# Patient Record
Sex: Male | Born: 1980 | Race: Black or African American | Hispanic: No | Marital: Single | Smoking: Former smoker
Health system: Southern US, Community
[De-identification: ages and names within clinical notes are randomized; demographics above are authoritative.]

## PROBLEM LIST (undated history)

## (undated) DIAGNOSIS — C801 Malignant (primary) neoplasm, unspecified: Secondary | ICD-10-CM

---

## 2010-01-27 ENCOUNTER — Observation Stay (HOSPITAL_COMMUNITY): Admission: AD | Admit: 2010-01-27 | Discharge: 2010-01-29 | Payer: Self-pay | Admitting: Internal Medicine

## 2010-01-27 ENCOUNTER — Ambulatory Visit: Payer: Self-pay | Admitting: Internal Medicine

## 2010-01-28 ENCOUNTER — Encounter (INDEPENDENT_AMBULATORY_CARE_PROVIDER_SITE_OTHER): Payer: Self-pay | Admitting: Internal Medicine

## 2010-01-31 ENCOUNTER — Ambulatory Visit: Payer: Self-pay | Admitting: Oncology

## 2010-02-01 LAB — CBC & DIFF AND RETIC
BASO%: 0.2 % (ref 0.0–2.0)
Eosinophils Absolute: 0 10*3/uL (ref 0.0–0.5)
HCT: 35.7 % — ABNORMAL LOW (ref 38.4–49.9)
Immature Retic Fract: 9.2 % (ref 0.00–13.40)
LYMPH%: 17.9 % (ref 14.0–49.0)
MCHC: 31.9 g/dL — ABNORMAL LOW (ref 32.0–36.0)
MONO#: 0.5 10*3/uL (ref 0.1–0.9)
NEUT#: 3.7 10*3/uL (ref 1.5–6.5)
Platelets: 400 10*3/uL (ref 140–400)
RBC: 4.37 10*6/uL (ref 4.20–5.82)
Retic %: 1.58 % (ref 0.50–1.60)
WBC: 5.2 10*3/uL (ref 4.0–10.3)
lymph#: 0.9 10*3/uL (ref 0.9–3.3)

## 2010-02-02 LAB — SEDIMENTATION RATE: Sed Rate: 56 mm/hr — ABNORMAL HIGH (ref 0–16)

## 2010-02-03 ENCOUNTER — Ambulatory Visit: Payer: Self-pay

## 2010-02-03 ENCOUNTER — Ambulatory Visit (HOSPITAL_COMMUNITY): Admission: RE | Admit: 2010-02-03 | Discharge: 2010-02-03 | Payer: Self-pay | Admitting: Oncology

## 2010-02-03 ENCOUNTER — Encounter: Payer: Self-pay | Admitting: Oncology

## 2010-02-03 ENCOUNTER — Ambulatory Visit: Payer: Self-pay | Admitting: Cardiology

## 2010-02-03 LAB — COMPREHENSIVE METABOLIC PANEL
ALT: 12 U/L (ref 0–53)
AST: 13 U/L (ref 0–37)
Albumin: 3.7 g/dL (ref 3.5–5.2)
Alkaline Phosphatase: 87 U/L (ref 39–117)
Potassium: 4.4 mEq/L (ref 3.5–5.3)
Sodium: 140 mEq/L (ref 135–145)
Total Bilirubin: 0.5 mg/dL (ref 0.3–1.2)
Total Protein: 7 g/dL (ref 6.0–8.3)

## 2010-02-03 LAB — URIC ACID: Uric Acid, Serum: 7.6 mg/dL (ref 4.0–7.8)

## 2010-02-04 ENCOUNTER — Other Ambulatory Visit: Admission: RE | Admit: 2010-02-04 | Discharge: 2010-02-04 | Payer: Self-pay | Admitting: Oncology

## 2010-02-08 ENCOUNTER — Ambulatory Visit (HOSPITAL_COMMUNITY): Admission: RE | Admit: 2010-02-08 | Discharge: 2010-02-08 | Payer: Self-pay | Admitting: Oncology

## 2010-02-10 ENCOUNTER — Ambulatory Visit (HOSPITAL_COMMUNITY): Admission: RE | Admit: 2010-02-10 | Discharge: 2010-02-10 | Payer: Self-pay | Admitting: Oncology

## 2010-02-15 LAB — COMPREHENSIVE METABOLIC PANEL
ALT: 20 U/L (ref 0–53)
Albumin: 3.6 g/dL (ref 3.5–5.2)
Alkaline Phosphatase: 103 U/L (ref 39–117)
CO2: 25 mEq/L (ref 19–32)
Glucose, Bld: 143 mg/dL — ABNORMAL HIGH (ref 70–99)
Potassium: 3.6 mEq/L (ref 3.5–5.3)
Sodium: 140 mEq/L (ref 135–145)
Total Protein: 6.4 g/dL (ref 6.0–8.3)

## 2010-02-15 LAB — CBC WITH DIFFERENTIAL/PLATELET
Basophils Absolute: 0 10*3/uL (ref 0.0–0.1)
HCT: 32.9 % — ABNORMAL LOW (ref 38.4–49.9)
HGB: 10.8 g/dL — ABNORMAL LOW (ref 13.0–17.1)
MONO#: 0 10*3/uL — ABNORMAL LOW (ref 0.1–0.9)
NEUT#: 29.7 10*3/uL — ABNORMAL HIGH (ref 1.5–6.5)
NEUT%: 98.2 % — ABNORMAL HIGH (ref 39.0–75.0)
WBC: 30.3 10*3/uL — ABNORMAL HIGH (ref 4.0–10.3)
lymph#: 0.5 10*3/uL — ABNORMAL LOW (ref 0.9–3.3)

## 2010-02-17 ENCOUNTER — Ambulatory Visit (HOSPITAL_COMMUNITY): Admission: RE | Admit: 2010-02-17 | Discharge: 2010-02-17 | Payer: Self-pay | Admitting: Oncology

## 2010-02-22 LAB — CBC WITH DIFFERENTIAL/PLATELET
BASO%: 0.2 % (ref 0.0–2.0)
EOS%: 0.2 % (ref 0.0–7.0)
Eosinophils Absolute: 0 10*3/uL (ref 0.0–0.5)
MCHC: 32 g/dL (ref 32.0–36.0)
MCV: 79.5 fL (ref 79.3–98.0)
MONO%: 3.9 % (ref 0.0–14.0)
NEUT#: 9.4 10*3/uL — ABNORMAL HIGH (ref 1.5–6.5)
RBC: 4.76 10*6/uL (ref 4.20–5.82)
RDW: 14.6 % (ref 11.0–14.6)
WBC: 11.2 10*3/uL — ABNORMAL HIGH (ref 4.0–10.3)

## 2010-02-22 LAB — COMPREHENSIVE METABOLIC PANEL
ALT: 14 U/L (ref 0–53)
AST: 16 U/L (ref 0–37)
Albumin: 4 g/dL (ref 3.5–5.2)
Alkaline Phosphatase: 83 U/L (ref 39–117)
Glucose, Bld: 111 mg/dL — ABNORMAL HIGH (ref 70–99)
Potassium: 3.9 mEq/L (ref 3.5–5.3)
Sodium: 141 mEq/L (ref 135–145)
Total Bilirubin: 0.2 mg/dL — ABNORMAL LOW (ref 0.3–1.2)
Total Protein: 6.8 g/dL (ref 6.0–8.3)

## 2010-03-01 DIAGNOSIS — J9819 Other pulmonary collapse: Secondary | ICD-10-CM | POA: Insufficient documentation

## 2010-03-02 ENCOUNTER — Ambulatory Visit: Payer: Self-pay | Admitting: Oncology

## 2010-03-02 ENCOUNTER — Ambulatory Visit (HOSPITAL_COMMUNITY): Admission: RE | Admit: 2010-03-02 | Discharge: 2010-03-02 | Payer: Self-pay | Admitting: Oncology

## 2010-03-03 ENCOUNTER — Telehealth: Payer: Self-pay | Admitting: Emergency Medicine

## 2010-03-04 LAB — CBC WITH DIFFERENTIAL/PLATELET
BASO%: 0.9 % (ref 0.0–2.0)
Basophils Absolute: 0 10*3/uL (ref 0.0–0.1)
EOS%: 0.2 % (ref 0.0–7.0)
HCT: 38.4 % (ref 38.4–49.9)
HGB: 12.4 g/dL — ABNORMAL LOW (ref 13.0–17.1)
LYMPH%: 32.4 % (ref 14.0–49.0)
MCH: 26.1 pg — ABNORMAL LOW (ref 27.2–33.4)
MCHC: 32.3 g/dL (ref 32.0–36.0)
MCV: 80.7 fL (ref 79.3–98.0)
NEUT%: 57 % (ref 39.0–75.0)
Platelets: 174 10*3/uL (ref 140–400)

## 2010-03-04 LAB — COMPREHENSIVE METABOLIC PANEL
AST: 13 U/L (ref 0–37)
Albumin: 4 g/dL (ref 3.5–5.2)
Alkaline Phosphatase: 62 U/L (ref 39–117)
BUN: 11 mg/dL (ref 6–23)
Calcium: 9.3 mg/dL (ref 8.4–10.5)
Creatinine, Ser: 0.81 mg/dL (ref 0.40–1.50)
Glucose, Bld: 86 mg/dL (ref 70–99)
Potassium: 4.2 mEq/L (ref 3.5–5.3)

## 2010-03-04 LAB — URIC ACID: Uric Acid, Serum: 6.3 mg/dL (ref 4.0–7.8)

## 2010-03-10 LAB — COMPREHENSIVE METABOLIC PANEL
ALT: 15 U/L (ref 0–53)
AST: 10 U/L (ref 0–37)
Albumin: 4.5 g/dL (ref 3.5–5.2)
Alkaline Phosphatase: 103 U/L (ref 39–117)
BUN: 17 mg/dL (ref 6–23)
CO2: 26 mEq/L (ref 19–32)
Calcium: 9.8 mg/dL (ref 8.4–10.5)
Chloride: 102 mEq/L (ref 96–112)
Creatinine, Ser: 0.74 mg/dL (ref 0.40–1.50)
Glucose, Bld: 150 mg/dL — ABNORMAL HIGH (ref 70–99)
Potassium: 3.7 mEq/L (ref 3.5–5.3)
Sodium: 141 mEq/L (ref 135–145)
Total Bilirubin: 0.4 mg/dL (ref 0.3–1.2)
Total Protein: 7 g/dL (ref 6.0–8.3)

## 2010-03-10 LAB — CBC WITH DIFFERENTIAL/PLATELET
BASO%: 0.1 % (ref 0.0–2.0)
Basophils Absolute: 0 10*3/uL (ref 0.0–0.1)
EOS%: 0 % (ref 0.0–7.0)
Eosinophils Absolute: 0 10*3/uL (ref 0.0–0.5)
HCT: 35 % — ABNORMAL LOW (ref 38.4–49.9)
HGB: 11.9 g/dL — ABNORMAL LOW (ref 13.0–17.1)
LYMPH%: 6.1 % — ABNORMAL LOW (ref 14.0–49.0)
MCH: 27.3 pg (ref 27.2–33.4)
MCHC: 34 g/dL (ref 32.0–36.0)
MCV: 80.2 fL (ref 79.3–98.0)
MONO#: 0.1 10*3/uL (ref 0.1–0.9)
MONO%: 0.5 % (ref 0.0–14.0)
NEUT#: 14.7 10*3/uL — ABNORMAL HIGH (ref 1.5–6.5)
NEUT%: 93.3 % — ABNORMAL HIGH (ref 39.0–75.0)
Platelets: 226 10*3/uL (ref 140–400)
RBC: 4.36 10*6/uL (ref 4.20–5.82)
RDW: 18.4 % — ABNORMAL HIGH (ref 11.0–14.6)
WBC: 15.7 10*3/uL — ABNORMAL HIGH (ref 4.0–10.3)
lymph#: 1 10*3/uL (ref 0.9–3.3)

## 2010-03-10 LAB — LACTATE DEHYDROGENASE: LDH: 196 U/L (ref 94–250)

## 2010-03-21 ENCOUNTER — Ambulatory Visit (HOSPITAL_COMMUNITY): Admission: RE | Admit: 2010-03-21 | Discharge: 2010-03-21 | Payer: Self-pay | Admitting: Oncology

## 2010-03-24 ENCOUNTER — Ambulatory Visit (HOSPITAL_COMMUNITY): Admission: RE | Admit: 2010-03-24 | Discharge: 2010-03-24 | Payer: Self-pay | Admitting: Oncology

## 2010-03-24 ENCOUNTER — Ambulatory Visit: Payer: Self-pay | Admitting: Emergency Medicine

## 2010-03-24 DIAGNOSIS — C8590 Non-Hodgkin lymphoma, unspecified, unspecified site: Secondary | ICD-10-CM

## 2010-03-25 LAB — CBC WITH DIFFERENTIAL/PLATELET
BASO%: 1.2 % (ref 0.0–2.0)
Basophils Absolute: 0.1 10*3/uL (ref 0.0–0.1)
EOS%: 0.5 % (ref 0.0–7.0)
Eosinophils Absolute: 0 10*3/uL (ref 0.0–0.5)
HCT: 36.1 % — ABNORMAL LOW (ref 38.4–49.9)
HGB: 12.5 g/dL — ABNORMAL LOW (ref 13.0–17.1)
LYMPH%: 16.7 % (ref 14.0–49.0)
MCH: 27.7 pg (ref 27.2–33.4)
MCHC: 34.7 g/dL (ref 32.0–36.0)
MCV: 80 fL (ref 79.3–98.0)
MONO#: 0.5 10*3/uL (ref 0.1–0.9)
MONO%: 9.8 % (ref 0.0–14.0)
NEUT#: 3.6 10*3/uL (ref 1.5–6.5)
NEUT%: 71.8 % (ref 39.0–75.0)
Platelets: 268 10*3/uL (ref 140–400)
RBC: 4.51 10*6/uL (ref 4.20–5.82)
RDW: 20.3 % — ABNORMAL HIGH (ref 11.0–14.6)
WBC: 5 10*3/uL (ref 4.0–10.3)
lymph#: 0.8 10*3/uL — ABNORMAL LOW (ref 0.9–3.3)

## 2010-03-25 LAB — COMPREHENSIVE METABOLIC PANEL
ALT: 19 U/L (ref 0–53)
AST: 20 U/L (ref 0–37)
Albumin: 3.8 g/dL (ref 3.5–5.2)
Alkaline Phosphatase: 72 U/L (ref 39–117)
BUN: 8 mg/dL (ref 6–23)
CO2: 30 mEq/L (ref 19–32)
Calcium: 9.6 mg/dL (ref 8.4–10.5)
Chloride: 104 mEq/L (ref 96–112)
Creatinine, Ser: 0.85 mg/dL (ref 0.40–1.50)
Glucose, Bld: 135 mg/dL — ABNORMAL HIGH (ref 70–99)
Potassium: 3.6 mEq/L (ref 3.5–5.3)
Sodium: 141 mEq/L (ref 135–145)
Total Bilirubin: 0.5 mg/dL (ref 0.3–1.2)
Total Protein: 7 g/dL (ref 6.0–8.3)

## 2010-03-25 LAB — LACTATE DEHYDROGENASE: LDH: 127 U/L (ref 94–250)

## 2010-03-25 LAB — URIC ACID: Uric Acid, Serum: 7.6 mg/dL (ref 4.0–7.8)

## 2010-03-30 ENCOUNTER — Ambulatory Visit: Payer: Self-pay | Admitting: Oncology

## 2010-04-01 LAB — CBC WITH DIFFERENTIAL/PLATELET
BASO%: 0.3 % (ref 0.0–2.0)
Basophils Absolute: 0 10*3/uL (ref 0.0–0.1)
EOS%: 0.2 % (ref 0.0–7.0)
Eosinophils Absolute: 0 10*3/uL (ref 0.0–0.5)
HCT: 33 % — ABNORMAL LOW (ref 38.4–49.9)
HGB: 11.4 g/dL — ABNORMAL LOW (ref 13.0–17.1)
LYMPH%: 13.1 % — ABNORMAL LOW (ref 14.0–49.0)
MCH: 28 pg (ref 27.2–33.4)
MCHC: 34.6 g/dL (ref 32.0–36.0)
MCV: 80.9 fL (ref 79.3–98.0)
MONO#: 0.1 10*3/uL (ref 0.1–0.9)
MONO%: 2 % (ref 0.0–14.0)
NEUT#: 5.2 10*3/uL (ref 1.5–6.5)
NEUT%: 84.4 % — ABNORMAL HIGH (ref 39.0–75.0)
Platelets: 188 10*3/uL (ref 140–400)
RBC: 4.07 10*6/uL — ABNORMAL LOW (ref 4.20–5.82)
RDW: 21.9 % — ABNORMAL HIGH (ref 11.0–14.6)
WBC: 6.2 10*3/uL (ref 4.0–10.3)
lymph#: 0.8 10*3/uL — ABNORMAL LOW (ref 0.9–3.3)

## 2010-04-01 LAB — COMPREHENSIVE METABOLIC PANEL
ALT: 18 U/L (ref 0–53)
AST: 12 U/L (ref 0–37)
Albumin: 3.6 g/dL (ref 3.5–5.2)
Alkaline Phosphatase: 90 U/L (ref 39–117)
BUN: 13 mg/dL (ref 6–23)
CO2: 31 mEq/L (ref 19–32)
Calcium: 9.1 mg/dL (ref 8.4–10.5)
Chloride: 102 mEq/L (ref 96–112)
Creatinine, Ser: 0.87 mg/dL (ref 0.40–1.50)
Glucose, Bld: 87 mg/dL (ref 70–99)
Potassium: 3.8 mEq/L (ref 3.5–5.3)
Sodium: 140 mEq/L (ref 135–145)
Total Bilirubin: 0.7 mg/dL (ref 0.3–1.2)
Total Protein: 6.4 g/dL (ref 6.0–8.3)

## 2010-04-01 LAB — URIC ACID: Uric Acid, Serum: 6.1 mg/dL (ref 4.0–7.8)

## 2010-04-01 LAB — LACTATE DEHYDROGENASE: LDH: 120 U/L (ref 94–250)

## 2010-04-04 ENCOUNTER — Ambulatory Visit
Admission: RE | Admit: 2010-04-04 | Discharge: 2010-05-02 | Payer: Self-pay | Source: Home / Self Care | Admitting: Radiation Oncology

## 2010-04-15 LAB — COMPREHENSIVE METABOLIC PANEL
ALT: 20 U/L (ref 0–53)
AST: 18 U/L (ref 0–37)
BUN: 11 mg/dL (ref 6–23)
Creatinine, Ser: 0.88 mg/dL (ref 0.40–1.50)
Total Bilirubin: 0.6 mg/dL (ref 0.3–1.2)

## 2010-04-15 LAB — CBC WITH DIFFERENTIAL/PLATELET
Basophils Absolute: 0 10*3/uL (ref 0.0–0.1)
Eosinophils Absolute: 0 10*3/uL (ref 0.0–0.5)
HGB: 12.5 g/dL — ABNORMAL LOW (ref 13.0–17.1)
MCV: 81 fL (ref 79.3–98.0)
MONO#: 0.5 10*3/uL (ref 0.1–0.9)
MONO%: 10.6 % (ref 0.0–14.0)
NEUT#: 3.2 10*3/uL (ref 1.5–6.5)
RDW: 21.5 % — ABNORMAL HIGH (ref 11.0–14.6)
WBC: 4.3 10*3/uL (ref 4.0–10.3)

## 2010-04-15 LAB — LACTATE DEHYDROGENASE: LDH: 134 U/L (ref 94–250)

## 2010-04-22 LAB — CBC WITH DIFFERENTIAL/PLATELET
BASO%: 0.1 % (ref 0.0–2.0)
EOS%: 0.3 % (ref 0.0–7.0)
HGB: 11.3 g/dL — ABNORMAL LOW (ref 13.0–17.1)
MCH: 27.5 pg (ref 27.2–33.4)
MCHC: 33.3 g/dL (ref 32.0–36.0)
RBC: 4.11 10*6/uL — ABNORMAL LOW (ref 4.20–5.82)
RDW: 22.2 % — ABNORMAL HIGH (ref 11.0–14.6)
lymph#: 0.8 10*3/uL — ABNORMAL LOW (ref 0.9–3.3)

## 2010-04-22 LAB — COMPREHENSIVE METABOLIC PANEL
ALT: 13 U/L (ref 0–53)
AST: 8 U/L (ref 0–37)
Albumin: 3.9 g/dL (ref 3.5–5.2)
Alkaline Phosphatase: 83 U/L (ref 39–117)
Calcium: 8.8 mg/dL (ref 8.4–10.5)
Chloride: 101 mEq/L (ref 96–112)
Potassium: 3.4 mEq/L — ABNORMAL LOW (ref 3.5–5.3)
Sodium: 139 mEq/L (ref 135–145)

## 2010-05-03 ENCOUNTER — Ambulatory Visit: Payer: Self-pay | Admitting: Oncology

## 2010-05-05 LAB — CBC WITH DIFFERENTIAL/PLATELET
BASO%: 0.6 % (ref 0.0–2.0)
Eosinophils Absolute: 0 10*3/uL (ref 0.0–0.5)
HCT: 34.6 % — ABNORMAL LOW (ref 38.4–49.9)
HGB: 12 g/dL — ABNORMAL LOW (ref 13.0–17.1)
MCHC: 34.7 g/dL (ref 32.0–36.0)
MONO#: 0.6 10*3/uL (ref 0.1–0.9)
NEUT#: 3.4 10*3/uL (ref 1.5–6.5)
NEUT%: 70.4 % (ref 39.0–75.0)
Platelets: 320 10*3/uL (ref 140–400)
WBC: 4.8 10*3/uL (ref 4.0–10.3)
lymph#: 0.8 10*3/uL — ABNORMAL LOW (ref 0.9–3.3)

## 2010-05-05 LAB — URIC ACID: Uric Acid, Serum: 8.7 mg/dL — ABNORMAL HIGH (ref 4.0–7.8)

## 2010-05-05 LAB — COMPREHENSIVE METABOLIC PANEL
ALT: 18 U/L (ref 0–53)
CO2: 28 mEq/L (ref 19–32)
Calcium: 9.3 mg/dL (ref 8.4–10.5)
Chloride: 105 mEq/L (ref 96–112)
Creatinine, Ser: 0.97 mg/dL (ref 0.40–1.50)
Glucose, Bld: 107 mg/dL — ABNORMAL HIGH (ref 70–99)
Total Protein: 6.3 g/dL (ref 6.0–8.3)

## 2010-05-05 LAB — LACTATE DEHYDROGENASE: LDH: 136 U/L (ref 94–250)

## 2010-05-13 LAB — CBC WITH DIFFERENTIAL/PLATELET
EOS%: 0.8 % (ref 0.0–7.0)
MCH: 28.4 pg (ref 27.2–33.4)
MCV: 84 fL (ref 79.3–98.0)
MONO%: 2.1 % (ref 0.0–14.0)
NEUT#: 6.1 10*3/uL (ref 1.5–6.5)
RBC: 3.81 10*6/uL — ABNORMAL LOW (ref 4.20–5.82)
RDW: 19.1 % — ABNORMAL HIGH (ref 11.0–14.6)

## 2010-05-26 LAB — COMPREHENSIVE METABOLIC PANEL
Albumin: 4 g/dL (ref 3.5–5.2)
Alkaline Phosphatase: 73 U/L (ref 39–117)
CO2: 25 mEq/L (ref 19–32)
Glucose, Bld: 100 mg/dL — ABNORMAL HIGH (ref 70–99)
Potassium: 3.6 mEq/L (ref 3.5–5.3)
Sodium: 143 mEq/L (ref 135–145)
Total Protein: 6.3 g/dL (ref 6.0–8.3)

## 2010-05-26 LAB — CBC WITH DIFFERENTIAL/PLATELET
Eosinophils Absolute: 0.1 10*3/uL (ref 0.0–0.5)
MONO#: 0.7 10*3/uL (ref 0.1–0.9)
MONO%: 13.2 % (ref 0.0–14.0)
NEUT#: 3.3 10*3/uL (ref 1.5–6.5)
RBC: 4.2 10*6/uL (ref 4.20–5.82)
RDW: 16.4 % — ABNORMAL HIGH (ref 11.0–14.6)
WBC: 5.1 10*3/uL (ref 4.0–10.3)
lymph#: 0.9 10*3/uL (ref 0.9–3.3)

## 2010-05-26 LAB — URIC ACID: Uric Acid, Serum: 6.7 mg/dL (ref 4.0–7.8)

## 2010-06-02 ENCOUNTER — Ambulatory Visit: Payer: Self-pay | Admitting: Oncology

## 2010-06-03 LAB — CBC WITH DIFFERENTIAL/PLATELET
BASO%: 0 % (ref 0.0–2.0)
EOS%: 1.4 % (ref 0.0–7.0)
HCT: 34.1 % — ABNORMAL LOW (ref 38.4–49.9)
LYMPH%: 12.8 % — ABNORMAL LOW (ref 14.0–49.0)
MCH: 28.6 pg (ref 27.2–33.4)
MCHC: 33.9 g/dL (ref 32.0–36.0)
MONO%: 6.6 % (ref 0.0–14.0)
NEUT%: 79.2 % — ABNORMAL HIGH (ref 39.0–75.0)
lymph#: 0.5 10*3/uL — ABNORMAL LOW (ref 0.9–3.3)

## 2010-06-10 ENCOUNTER — Ambulatory Visit (HOSPITAL_COMMUNITY)
Admission: RE | Admit: 2010-06-10 | Discharge: 2010-06-10 | Payer: Self-pay | Source: Home / Self Care | Attending: Oncology | Admitting: Oncology

## 2010-06-10 LAB — GLUCOSE, CAPILLARY: Glucose-Capillary: 87 mg/dL (ref 70–99)

## 2010-06-14 ENCOUNTER — Ambulatory Visit
Admission: RE | Admit: 2010-06-14 | Discharge: 2010-07-05 | Payer: Self-pay | Source: Home / Self Care | Attending: Radiation Oncology | Admitting: Radiation Oncology

## 2010-06-17 LAB — CBC WITH DIFFERENTIAL/PLATELET
BASO%: 0.8 % (ref 0.0–2.0)
Basophils Absolute: 0 10*3/uL (ref 0.0–0.1)
EOS%: 1 % (ref 0.0–7.0)
Eosinophils Absolute: 0 10*3/uL (ref 0.0–0.5)
HCT: 36.2 % — ABNORMAL LOW (ref 38.4–49.9)
HGB: 12.3 g/dL — ABNORMAL LOW (ref 13.0–17.1)
LYMPH%: 26.2 % (ref 14.0–49.0)
MCH: 28.4 pg (ref 27.2–33.4)
MCHC: 34 g/dL (ref 32.0–36.0)
MCV: 83.6 fL (ref 79.3–98.0)
MONO#: 0.6 10*3/uL (ref 0.1–0.9)
MONO%: 15.6 % — ABNORMAL HIGH (ref 0.0–14.0)
NEUT#: 2.3 10*3/uL (ref 1.5–6.5)
NEUT%: 56.4 % (ref 39.0–75.0)
Platelets: 334 10*3/uL (ref 140–400)
RBC: 4.33 10*6/uL (ref 4.20–5.82)
RDW: 14.7 % — ABNORMAL HIGH (ref 11.0–14.6)
WBC: 4.1 10*3/uL (ref 4.0–10.3)
lymph#: 1.1 10*3/uL (ref 0.9–3.3)

## 2010-07-05 NOTE — Assessment & Plan Note (Signed)
Summary: NHL   Visit Type:  Follow-up Primary Provider/Referring Provider:  Michel Bickers MD  CC:  hfu. pt states he has no problems and has been doing fine. pt currently on chemo. Marland Kitchen  History of Present Illness: Very pleasant man seen by our service during hosp 8/11 when he was dx with NHL. He presented w R hilar mass with RML and RUL atelectasis and effusion. Needle bx was performed to make dx. He was seen by Dr Donnie Coffin, has been treated with chemo therapy. He is feeling much better. A CT scan of the chest was performed 03/24/10 and showed remarkable improvement in his tumor burden, R lung aeration, and effusion. He presents for hosp f/u today.   Preventive Screening-Counseling & Management  Alcohol-Tobacco     Smoking Status: quit  Current Medications (verified): 1)  Allopurinol 300 Mg Tabs (Allopurinol) .... Once Daily 2)  Prednisone 50 Mg Tabs (Prednisone) .... Taper As Directed  Allergies (verified): No Known Drug Allergies  Past History:  Family History: Last updated: 03/24/2010 no known family history  Past Medical History: non hodgkins lymphoma  Past Surgical History: port a cath  Family History: no known family history  Social History: Reviewed history and no changes required. Patient states former smoker. quit 2008. started 1999. 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    Vital Signs:  Patient profile:   30  year old male Height:      68 inches Weight:      181.50 pounds BMI:     27.70 O2 Sat:      99 % on Room air Temp:     98.2 degrees F oral Pulse rate:   101 / minute BP sitting:   122 / 80  (right arm) Cuff size:   regular  Vitals Entered By: Carver Fila (March 24, 2010 3:04 PM)  O2 Flow:  Room air CC: hfu. pt states he has no problems and has been doing fine. pt currently on chemo.  Comments meds and allergies updated Phone number updated Carver Fila  March 24, 2010 3:04 PM    Physical Exam  General:  well developed, well nourished, in no acute distress Head:  normocephalic and atraumatic Eyes:  conjunctiva and sclera clear Nose:  no deformity, discharge, inflammation, or lesions Mouth:  no deformity or lesions Neck:  no masses, thyromegaly, or abnormal cervical nodes Lungs:  clear bilaterally to auscultation and percussion Heart:  regular rate and rhythm, S1, S2 without murmurs, rubs, gallops, or clicks Msk:  no deformity or scoliosis noted with normal posture Extremities:  no clubbing, cyanosis, edema, or deformity noted Neurologic:  non-focal Skin:  intact without lesions or rashes Psych:  alert and cooperative; normal mood and affect; normal attention span and concentration   CT Scan  Procedure date:  03/24/2010  Findings:       CT CHEST    Findings:  No enlarged axillary lymph nodes.    Pretracheal lymph node within the superior mediastinum currently   measures 1 cm, image number 19.  On the previous exam  this measured   2.1 cm.  Right paratracheal lymph node measures 1 cm, image 22.   This is compared with 1.9 cm previously.    The large right suprahilar / paramediastinal mass has decreased in   size from previous exam.  On today's study there is residual   paramediastinal soft tissue which measures 3.2 x 6.0 cm, image 29.   On the previous exam this measured approximately 10.7 x 9.1 cm.    Within the periphery of the right upper lobe there are  several   peripheral areas of airspace consolidation which measure up to 4.30   x 2.8 cm, image number 25.    Within the right middle lobe there is a residual area of soft   tissue attenuation measuring 1.8 x 3.3 cm, image 39.  On the   previous examination this measured 10.5 x 8.0 cm.    Left lung appears clear.    The trachea is now midline and appears patent.    Interval resolution of previously noted right pleural   effusion.Review of the visualized osseous structures shows no   suspicious bone lesions.    IMPRESSION:    1.  Significant interval response to therapy.   2.  There are several areas of residual soft tissue within the   right upper lobe, and  right middle lobe which may represent   residual tumor.  A PET CT may be helpful for further evaluation.   3.  Resolution of right pleural effusion.  Impression & Recommendations:  Problem # 1:  NON-HODGKIN'S LYMPHOMA (ICD-202.80)  Doing very well on chemo - rx per Dr Donnie Coffin - he can follow with me as needed   Orders: New Patient Level IV (40981)  Medications Added to Medication List This Visit: 1)  Allopurinol 300 Mg Tabs (Allopurinol) .... Once daily 2)  Prednisone 50 Mg Tabs (Prednisone) .... Taper as directed   Immunization History:  Influenza Immunization History:    Influenza:  historical (01/04/2008)

## 2010-07-05 NOTE — Progress Notes (Signed)
Summary: nos appt  Phone Note Call from Patient   Caller: [email or web address]  Call For: Addalyne Vandehei Summary of Call: Rsc nos from 9/28 to 10/20 @ 3:10p. Initial call taken by: Darletta Moll,  March 03, 2010 10:09 AM

## 2010-07-05 NOTE — Miscellaneous (Signed)
Summary: Orders Update  Clinical Lists Changes  Orders: Added new Service order of New Patient Level IV (99204) - Signed 

## 2010-07-06 ENCOUNTER — Ambulatory Visit: Payer: BC Managed Care – PPO | Attending: Radiation Oncology | Admitting: Radiation Oncology

## 2010-07-06 DIAGNOSIS — C8582 Other specified types of non-Hodgkin lymphoma, intrathoracic lymph nodes: Secondary | ICD-10-CM | POA: Insufficient documentation

## 2010-07-06 DIAGNOSIS — Z510 Encounter for antineoplastic radiation therapy: Secondary | ICD-10-CM | POA: Insufficient documentation

## 2010-07-06 DIAGNOSIS — C8589 Other specified types of non-Hodgkin lymphoma, extranodal and solid organ sites: Secondary | ICD-10-CM | POA: Insufficient documentation

## 2010-08-17 LAB — GLUCOSE, CAPILLARY: Glucose-Capillary: 91 mg/dL (ref 70–99)

## 2010-08-18 LAB — DIFFERENTIAL
Eosinophils Relative: 1 % (ref 0–5)
Lymphocytes Relative: 18 % (ref 12–46)
Lymphs Abs: 1.1 10*3/uL (ref 0.7–4.0)
Monocytes Absolute: 0.8 10*3/uL (ref 0.1–1.0)
Monocytes Relative: 13 % — ABNORMAL HIGH (ref 3–12)
Neutro Abs: 4.1 10*3/uL (ref 1.7–7.7)

## 2010-08-18 LAB — CBC
HCT: 33.1 % — ABNORMAL LOW (ref 39.0–52.0)
Hemoglobin: 11.1 g/dL — ABNORMAL LOW (ref 13.0–17.0)
MCV: 79.2 fL (ref 78.0–100.0)
RBC: 4.18 MIL/uL — ABNORMAL LOW (ref 4.22–5.81)
RDW: 13.6 % (ref 11.5–15.5)
WBC: 6 10*3/uL (ref 4.0–10.5)

## 2010-08-18 LAB — GLUCOSE, CAPILLARY: Glucose-Capillary: 102 mg/dL — ABNORMAL HIGH (ref 70–99)

## 2010-08-19 LAB — BASIC METABOLIC PANEL
BUN: 8 mg/dL (ref 6–23)
CO2: 25 mEq/L (ref 19–32)
CO2: 27 mEq/L (ref 19–32)
CO2: 29 mEq/L (ref 19–32)
Calcium: 9 mg/dL (ref 8.4–10.5)
Chloride: 100 mEq/L (ref 96–112)
Creatinine, Ser: 0.78 mg/dL (ref 0.4–1.5)
Creatinine, Ser: 0.92 mg/dL (ref 0.4–1.5)
GFR calc Af Amer: >60 mL/min (ref >60)
GFR calc Af Amer: >60 mL/min (ref >60)
GFR calc non Af Amer: >60 mL/min (ref >60)
Glucose, Bld: 102 mg/dL — ABNORMAL HIGH (ref 70–99)
Glucose, Bld: 112 mg/dL — ABNORMAL HIGH (ref 70–99)
Glucose, Bld: 92 mg/dL (ref 70–99)
Potassium: 3.9 mEq/L (ref 3.5–5.1)
Sodium: 137 mEq/L (ref 135–145)

## 2010-08-19 LAB — CARDIAC PANEL(CRET KIN+CKTOT+MB+TROPI)
CK, MB: 0.2 ng/mL — ABNORMAL LOW (ref 0.3–4.0)
Relative Index: INVALID (ref 0.0–2.5)
Relative Index: INVALID (ref 0.0–2.5)
Total CK: 56 U/L (ref 7–232)
Troponin I: <0.01 ng/mL (ref 0.00–0.06)

## 2010-08-19 LAB — CBC
Hemoglobin: 10.2 g/dL — ABNORMAL LOW (ref 13.0–17.0)
MCH: 25.9 pg — ABNORMAL LOW (ref 26.0–34.0)
MCH: 26.4 pg (ref 26.0–34.0)
MCHC: 31.5 g/dL (ref 30.0–36.0)
MCHC: 32.4 g/dL (ref 30.0–36.0)
MCV: 81.2 fL (ref 78.0–100.0)
MCV: 82 fL (ref 78.0–100.0)
Platelets: 455 10*3/uL — ABNORMAL HIGH (ref 150–400)
Platelets: 469 10*3/uL — ABNORMAL HIGH (ref 150–400)
Platelets: 469 10*3/uL — ABNORMAL HIGH (ref 150–400)
RBC: 3.93 MIL/uL — ABNORMAL LOW (ref 4.22–5.81)
RBC: 4.09 MIL/uL — ABNORMAL LOW (ref 4.22–5.81)
RBC: 4.1 MIL/uL — ABNORMAL LOW (ref 4.22–5.81)
RDW: 13 % (ref 11.5–15.5)

## 2010-08-19 LAB — URINE CULTURE
Colony Count: 7000
Culture  Setup Time: 201108260405

## 2010-08-19 LAB — DIFFERENTIAL
Basophils Absolute: 0 10*3/uL (ref 0.0–0.1)
Basophils Relative: 0 % (ref 0–1)
Eosinophils Absolute: 0.1 10*3/uL (ref 0.0–0.7)
Eosinophils Relative: 1 % (ref 0–5)
Neutrophils Relative %: 73 % (ref 43–77)

## 2010-08-19 LAB — POCT I-STAT 3, ART BLOOD GAS (G3+)
TCO2: 30 mmol/L (ref 0–100)
pCO2 arterial: 42.4 mmHg (ref 35.0–45.0)
pH, Arterial: 7.438 (ref 7.350–7.450)

## 2010-08-19 LAB — CULTURE, BLOOD (ROUTINE X 2): Culture: NO GROWTH

## 2010-08-19 LAB — MAGNESIUM: Magnesium: 2 mg/dL (ref 1.5–2.5)

## 2010-08-19 LAB — GLUCOSE, CAPILLARY: Glucose-Capillary: 98 mg/dL (ref 70–99)

## 2010-08-19 LAB — URINALYSIS, ROUTINE W REFLEX MICROSCOPIC
Glucose, UA: NEGATIVE mg/dL
Hgb urine dipstick: NEGATIVE
Specific Gravity, Urine: >1.046 — ABNORMAL HIGH (ref 1.005–1.030)

## 2010-08-19 LAB — PROTIME-INR
INR: 1.05 (ref 0.00–1.49)
Prothrombin Time: 13.9 seconds (ref 11.6–15.2)

## 2010-08-19 LAB — PROCALCITONIN: Procalcitonin: <0.1 ng/mL

## 2010-08-23 ENCOUNTER — Ambulatory Visit: Payer: BC Managed Care – PPO | Attending: Radiation Oncology | Admitting: Radiation Oncology

## 2010-08-29 ENCOUNTER — Other Ambulatory Visit: Payer: Self-pay | Admitting: Physician Assistant

## 2010-08-29 ENCOUNTER — Encounter (HOSPITAL_BASED_OUTPATIENT_CLINIC_OR_DEPARTMENT_OTHER): Payer: BC Managed Care – PPO | Admitting: Oncology

## 2010-08-29 DIAGNOSIS — C8589 Other specified types of non-Hodgkin lymphoma, extranodal and solid organ sites: Secondary | ICD-10-CM

## 2010-08-29 DIAGNOSIS — C8582 Other specified types of non-Hodgkin lymphoma, intrathoracic lymph nodes: Secondary | ICD-10-CM

## 2010-08-29 LAB — CBC WITH DIFFERENTIAL/PLATELET
BASO%: 0.4 % (ref 0.0–2.0)
Eosinophils Absolute: 0 10*3/uL (ref 0.0–0.5)
LYMPH%: 17.7 % (ref 14.0–49.0)
MCHC: 35 g/dL (ref 32.0–36.0)
MONO#: 0.3 10*3/uL (ref 0.1–0.9)
NEUT#: 1.8 10*3/uL (ref 1.5–6.5)
RBC: 4.91 10*6/uL (ref 4.20–5.82)
RDW: 14.5 % (ref 11.0–14.6)
WBC: 2.7 10*3/uL — ABNORMAL LOW (ref 4.0–10.3)

## 2010-08-29 LAB — COMPREHENSIVE METABOLIC PANEL
ALT: 39 U/L (ref 0–53)
AST: 24 U/L (ref 0–37)
CO2: 26 mEq/L (ref 19–32)
Chloride: 107 mEq/L (ref 96–112)
Creatinine, Ser: 0.77 mg/dL (ref 0.40–1.50)
Sodium: 140 mEq/L (ref 135–145)
Total Bilirubin: 0.6 mg/dL (ref 0.3–1.2)
Total Protein: 6.9 g/dL (ref 6.0–8.3)

## 2010-08-29 LAB — LACTATE DEHYDROGENASE: LDH: 128 U/L (ref 94–250)

## 2010-08-30 ENCOUNTER — Other Ambulatory Visit: Payer: Self-pay | Admitting: Oncology

## 2010-08-30 DIAGNOSIS — C8580 Other specified types of non-Hodgkin lymphoma, unspecified site: Secondary | ICD-10-CM

## 2010-09-29 ENCOUNTER — Encounter (HOSPITAL_COMMUNITY)
Admission: RE | Admit: 2010-09-29 | Discharge: 2010-09-29 | Disposition: A | Discharge status: Home / Self Care | Payer: BC Managed Care – PPO | Source: Ambulatory Visit | Attending: Oncology | Admitting: Oncology

## 2010-09-29 ENCOUNTER — Encounter (HOSPITAL_COMMUNITY): Payer: Self-pay

## 2010-09-29 DIAGNOSIS — C8580 Other specified types of non-Hodgkin lymphoma, unspecified site: Secondary | ICD-10-CM

## 2010-09-29 DIAGNOSIS — Z923 Personal history of irradiation: Secondary | ICD-10-CM | POA: Insufficient documentation

## 2010-09-29 DIAGNOSIS — Z9221 Personal history of antineoplastic chemotherapy: Secondary | ICD-10-CM | POA: Insufficient documentation

## 2010-09-29 DIAGNOSIS — C8589 Other specified types of non-Hodgkin lymphoma, extranodal and solid organ sites: Secondary | ICD-10-CM | POA: Insufficient documentation

## 2010-09-29 DIAGNOSIS — M799 Soft tissue disorder, unspecified: Secondary | ICD-10-CM | POA: Insufficient documentation

## 2010-09-29 HISTORY — DX: Malignant (primary) neoplasm, unspecified: C80.1

## 2010-09-29 LAB — GLUCOSE, CAPILLARY: Glucose-Capillary: 90 mg/dL (ref 70–99)

## 2010-09-29 MED ORDER — IOHEXOL 300 MG/ML  SOLN
100.0000 mL | Freq: Once | INTRAMUSCULAR | Status: AC | PRN
  Administered 2010-09-29: 100 mL via INTRAVENOUS

## 2010-09-29 MED ORDER — FLUDEOXYGLUCOSE F - 18 (FDG) INJECTION
15.6000 | Freq: Once | INTRAVENOUS | Status: AC | PRN
  Administered 2010-09-29: 15.6 via INTRAVENOUS

## 2010-10-24 ENCOUNTER — Other Ambulatory Visit: Payer: Self-pay | Admitting: Physician Assistant

## 2010-10-24 ENCOUNTER — Other Ambulatory Visit: Payer: Self-pay | Admitting: Oncology

## 2010-10-24 ENCOUNTER — Encounter (HOSPITAL_BASED_OUTPATIENT_CLINIC_OR_DEPARTMENT_OTHER): Payer: BC Managed Care – PPO | Admitting: Oncology

## 2010-10-24 DIAGNOSIS — C8582 Other specified types of non-Hodgkin lymphoma, intrathoracic lymph nodes: Secondary | ICD-10-CM

## 2010-10-24 DIAGNOSIS — C8589 Other specified types of non-Hodgkin lymphoma, extranodal and solid organ sites: Secondary | ICD-10-CM

## 2010-10-24 DIAGNOSIS — C8590 Non-Hodgkin lymphoma, unspecified, unspecified site: Secondary | ICD-10-CM

## 2010-10-24 LAB — CBC WITH DIFFERENTIAL/PLATELET
Eosinophils Absolute: 0.1 10*3/uL (ref 0.0–0.5)
HCT: 40.4 % (ref 38.4–49.9)
HGB: 14.3 g/dL (ref 13.0–17.1)
MCH: 30.1 pg (ref 27.2–33.4)
MCHC: 35.3 g/dL (ref 32.0–36.0)
MCV: 85.3 fL (ref 79.3–98.0)

## 2010-10-24 LAB — COMPREHENSIVE METABOLIC PANEL
Albumin: 4.6 g/dL (ref 3.5–5.2)
CO2: 23 mEq/L (ref 19–32)
Chloride: 105 mEq/L (ref 96–112)
Glucose, Bld: 88 mg/dL (ref 70–99)
Potassium: 4.3 mEq/L (ref 3.5–5.3)
Sodium: 138 mEq/L (ref 135–145)
Total Protein: 6.6 g/dL (ref 6.0–8.3)

## 2010-10-24 LAB — LACTATE DEHYDROGENASE: LDH: 192 U/L (ref 94–250)

## 2010-12-19 ENCOUNTER — Encounter (HOSPITAL_BASED_OUTPATIENT_CLINIC_OR_DEPARTMENT_OTHER): Payer: BC Managed Care – PPO | Admitting: Oncology

## 2010-12-19 DIAGNOSIS — C8589 Other specified types of non-Hodgkin lymphoma, extranodal and solid organ sites: Secondary | ICD-10-CM

## 2010-12-19 DIAGNOSIS — Z452 Encounter for adjustment and management of vascular access device: Secondary | ICD-10-CM

## 2010-12-20 ENCOUNTER — Ambulatory Visit
Admission: RE | Admit: 2010-12-20 | Discharge: 2010-12-20 | Disposition: A | Discharge status: Home / Self Care | Payer: BC Managed Care – PPO | Source: Ambulatory Visit | Attending: Radiation Oncology | Admitting: Radiation Oncology

## 2011-02-07 ENCOUNTER — Encounter (HOSPITAL_COMMUNITY)
Admission: RE | Admit: 2011-02-07 | Discharge: 2011-02-07 | Disposition: A | Discharge status: Home / Self Care | Payer: BC Managed Care – PPO | Source: Ambulatory Visit | Attending: Oncology | Admitting: Oncology

## 2011-02-07 ENCOUNTER — Encounter (HOSPITAL_COMMUNITY): Payer: Self-pay

## 2011-02-07 DIAGNOSIS — J984 Other disorders of lung: Secondary | ICD-10-CM | POA: Insufficient documentation

## 2011-02-07 DIAGNOSIS — K7689 Other specified diseases of liver: Secondary | ICD-10-CM | POA: Insufficient documentation

## 2011-02-07 DIAGNOSIS — C8590 Non-Hodgkin lymphoma, unspecified, unspecified site: Secondary | ICD-10-CM

## 2011-02-07 DIAGNOSIS — C8589 Other specified types of non-Hodgkin lymphoma, extranodal and solid organ sites: Secondary | ICD-10-CM | POA: Insufficient documentation

## 2011-02-07 MED ORDER — FLUDEOXYGLUCOSE F - 18 (FDG) INJECTION
18.5000 | Freq: Once | INTRAVENOUS | Status: AC | PRN
  Administered 2011-02-07: 18.5 via INTRAVENOUS

## 2011-02-07 MED ORDER — IOHEXOL 300 MG/ML  SOLN
100.0000 mL | Freq: Once | INTRAMUSCULAR | Status: AC | PRN
  Administered 2011-02-07: 100 mL via INTRAVENOUS

## 2011-02-10 IMAGING — XA IR US GUIDE VASC ACCESS RIGHT
1 series · 1 of 1 positions shown · non-contrast
Comparison: none

CLINICAL DATA: Non-Hodgkin's lymphoma.  The patient requires a Port-
A-Cath for further chemotherapy needs.

[Series 300: line placements · 1 of 1 slices shown]
[im 1/1]
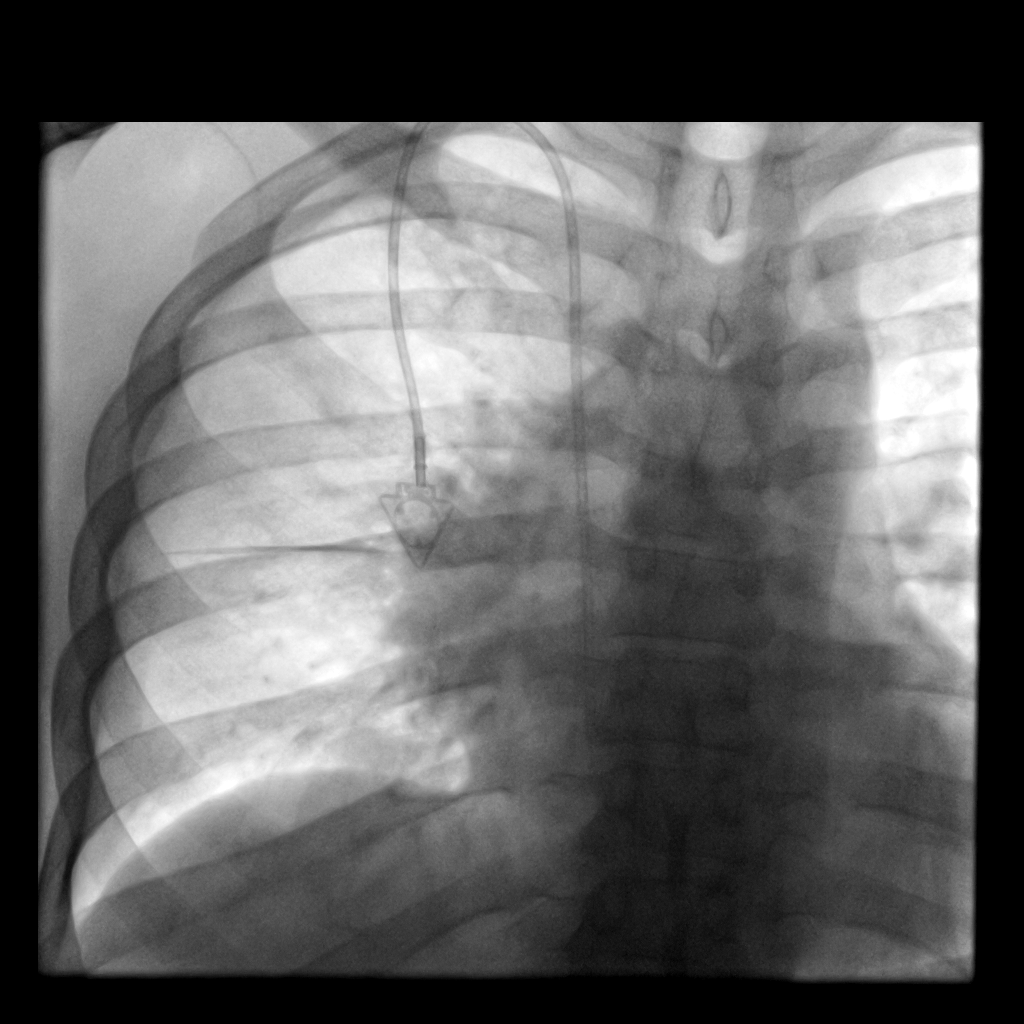

[1 of 1 positions shown; findings below may reference images not displayed]

IMPLANTED PORT A CATH PLACEMENT WITH ULTRASOUND AND FLUOROSCOPIC
GUIDANCE

Sedation:  2.0 mg IV Versed; 75 mcg IV Fentanyl.

Total Moderate Sedation Time:  45 minutes.

Additional Medications:  1 gram IV Ancef.  IV antibiotic was given
in a appropriate time interval prior to skin puncture.

Fluoroscopy Time:  0.4 minutes.

Procedure:  The procedure, risks, benefits, and alternatives were
explained to the patient.  Questions regarding the procedure were
encouraged and answered.  The patient understands and consents to
the procedure.

The right neck and chest were prepped with chlorhexidine in a
sterile fashion, and a sterile drape was applied covering the
operative field.  Maximum barrier sterile technique with sterile
gowns and gloves were used for the procedure.  Local anesthesia was
provided with 1% lidocaine and lidocaine with epinephrine.

After creating a small venotomy incision, a 21 gauge needle was
advanced into the right internal jugular vein under direct, real-
time ultrasound guidance.  Ultrasound image documentation was
performed.  After securing guidewire access, an 8 Fr dilator was
placed.  A J-wire was kinked to measure appropriate catheter
length.

A subcutaneous port pocket was then created along the upper chest
wall utilizing sharp and blunt dissection.  Portable cautery was
utilized.  The pocket was irrigated with sterile saline.

A single lumen power injectable port was chosen for placement.  The
8 Fr catheter was tunneled from the port pocket site to the
venotomy incision.  The port was placed in the pocket and secured
with two Ethilon tacking sutures.  External catheter was trimmed to
appropriate length based on guidewire measurement.

At the venotomy, an 8 Fr peel-away sheath was placed over a
guidewire.  The catheter was then placed through the sheath and the
sheath removed.  Final catheter positioning was confirmed and
documented with a fluoroscopic spot image.  The port was accessed
with a needle and aspirated and flushed with heparinized saline.
The needle was removed.

The venotomy and port pocket incisions were closed with
subcutaneous 3-0 Monocryl and subcuticular 4-0 Vicryl.  Dermabond
was applied to both incisions.

Complications: None.  No pneumothorax.
FINDINGS: After catheter placement, the tip lies at the cavoatrial
junction.  The catheter aspirates normally and is ready for
immediate use.
IMPRESSION: Placement of single lumen port a cath via right internal jugular
vein.  The catheter tip lies at the cavoatrial junction.  A power
injectable port a cath was placed and is ready for immediate use.

## 2011-02-24 ENCOUNTER — Other Ambulatory Visit: Payer: Self-pay | Admitting: Oncology

## 2011-02-24 ENCOUNTER — Other Ambulatory Visit: Payer: Self-pay | Admitting: Physician Assistant

## 2011-02-24 ENCOUNTER — Encounter (HOSPITAL_BASED_OUTPATIENT_CLINIC_OR_DEPARTMENT_OTHER): Payer: BC Managed Care – PPO | Admitting: Oncology

## 2011-02-24 DIAGNOSIS — C8582 Other specified types of non-Hodgkin lymphoma, intrathoracic lymph nodes: Secondary | ICD-10-CM

## 2011-02-24 DIAGNOSIS — C8589 Other specified types of non-Hodgkin lymphoma, extranodal and solid organ sites: Secondary | ICD-10-CM

## 2011-02-24 DIAGNOSIS — C8590 Non-Hodgkin lymphoma, unspecified, unspecified site: Secondary | ICD-10-CM

## 2011-02-24 LAB — COMPREHENSIVE METABOLIC PANEL
Albumin: 4.7 g/dL (ref 3.5–5.2)
BUN: 9 mg/dL (ref 6–23)
Calcium: 9.9 mg/dL (ref 8.4–10.5)
Chloride: 105 mEq/L (ref 96–112)
Creatinine, Ser: 0.9 mg/dL (ref 0.50–1.35)
Glucose, Bld: 91 mg/dL (ref 70–99)
Potassium: 4.4 mEq/L (ref 3.5–5.3)

## 2011-02-24 LAB — CBC WITH DIFFERENTIAL/PLATELET
Basophils Absolute: 0 10*3/uL (ref 0.0–0.1)
Eosinophils Absolute: 0 10*3/uL (ref 0.0–0.5)
HCT: 41.6 % (ref 38.4–49.9)
HGB: 15 g/dL (ref 13.0–17.1)
MCV: 83.9 fL (ref 79.3–98.0)
MONO%: 6.2 % (ref 0.0–14.0)
NEUT#: 1.7 10*3/uL (ref 1.5–6.5)
NEUT%: 61.6 % (ref 39.0–75.0)
RDW: 12.7 % (ref 11.0–14.6)
lymph#: 0.9 10*3/uL (ref 0.9–3.3)

## 2011-03-01 IMAGING — CT NM PET TUM IMG RESTAG (PS) SKULL BASE T - THIGH
5 series · 25 of 25 positions shown · IV contrast ([ID])
Comparison: 

CLINICAL DATA: Subsequent treatment strategy for lymphoma
restaging.

NUCLEAR MEDICINE PET CT RESTAGING (PS) SKULL BASE TO THIGH
TECHNIQUE: 17.4 mCi F-18 FDG was injected intravenously via the
left antecubital fossa.  Full-ring PET imaging was performed from
the skull base through the mid-thighs 59  minutes after injection.
CT data was obtained and used for attenuation correction and
anatomic localization only.  (This was not acquired as a diagnostic
CT examination.)
Fasting Blood Glucose:  91

[Series 2: pet nac · axial · 3.3mm · 4.69mm/px · z∈[-878,-8]mm · 7 of 267 slices shown]
[im 1/267]
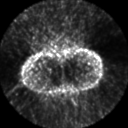
[im 45/267]
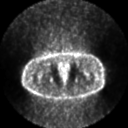
[im 89/267]
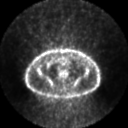
[im 134/267]
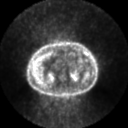
[im 178/267]
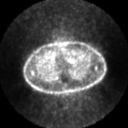
[im 222/267]
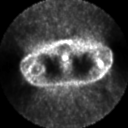
[im 267/267]
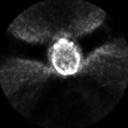

[Series 2: ct images · axial · 3.8mm · 0.98mm/px · z∈[-878,-8]mm · 8 of 267 slices shown]
[im 1/267]
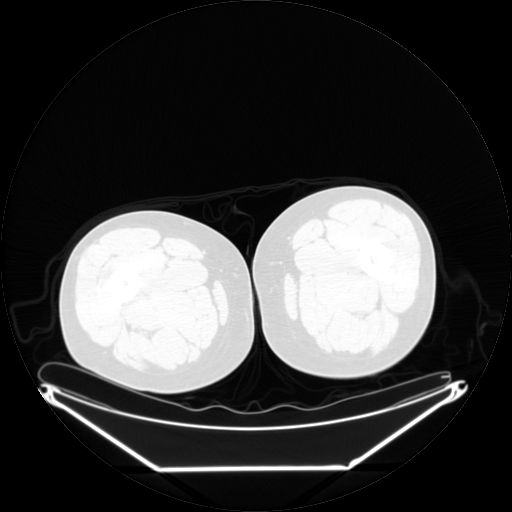
[im 39/267]
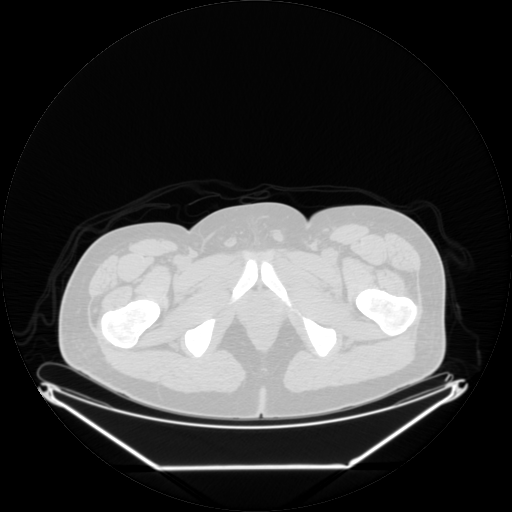
[im 77/267]
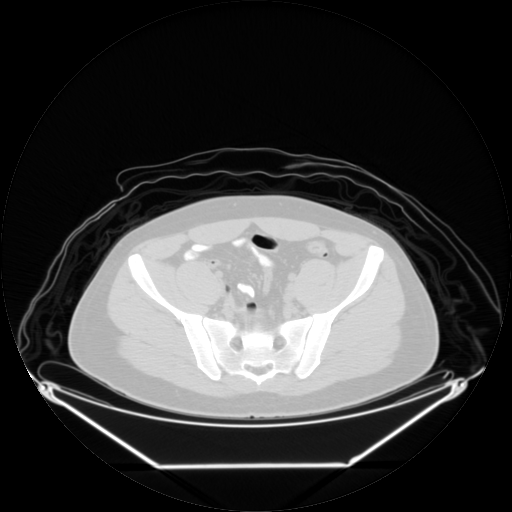
[im 115/267]
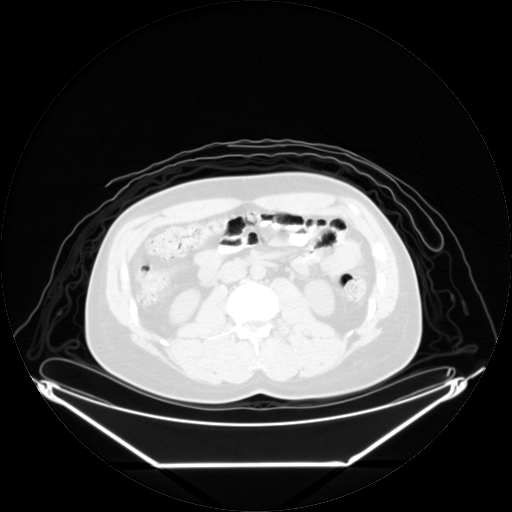
[im 153/267]
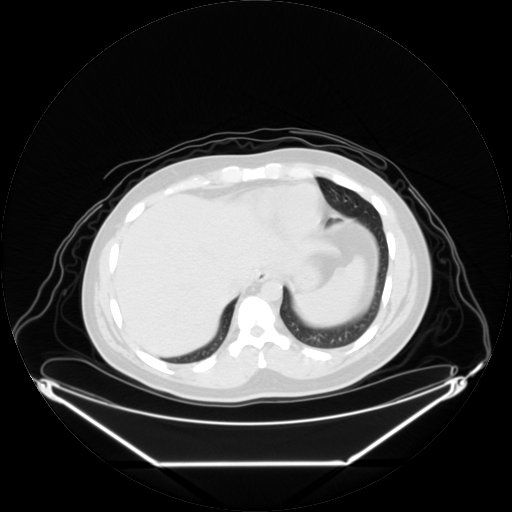
[im 191/267]
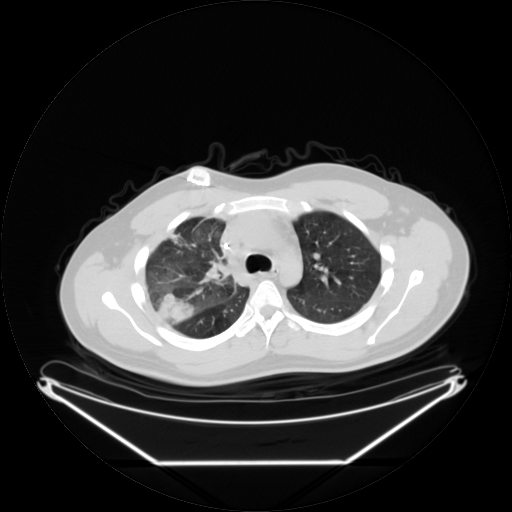
[im 229/267]
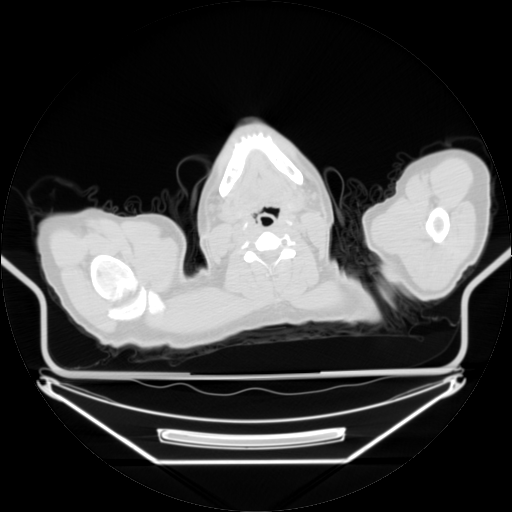
[im 267/267  brain]
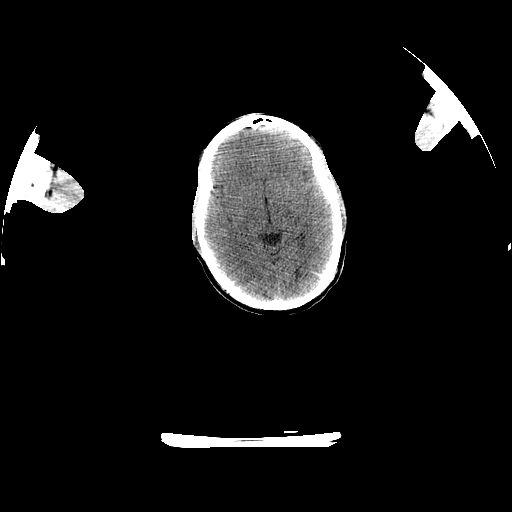

[Series 123: mip · coronal · 3.3mm · 4.69mm/px · 1 of 25 slices shown]
[im 1/25]
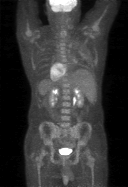

[Series 151: reformatted · axial · 3.3mm · 3.91mm/px · z∈[-878,-8]mm · 7 of 265 slices shown (1 of 2)]
[im 1/265]
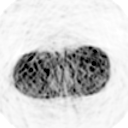
[im 45/265]
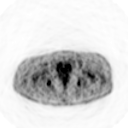
[im 89/265]
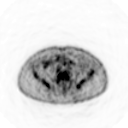
[im 133/265]
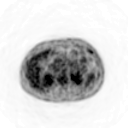
[im 177/265]
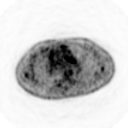
[im 221/265]
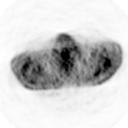
[im 265/265]
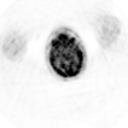

[Series 153: reformatted · coronal · 4.7mm · 6.98mm/px · 2 of 67 slices shown (2 of 2)]
[im 1/67]
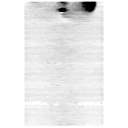
[im 67/67]
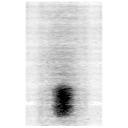

[25 of 25 positions shown; findings below may reference images not displayed]

FINDINGS: There has been dramatic interval reduction of disease.
The multiple areas  of hypermetabolic tumor seen on the previous
study have shown a fairly dramatic interval response with F D G
uptake at or just above background soft tissue levels at this time.

The large hypermetabolic mass which is seen around the right hilar
measures 3.6 x 5.6 cm today on transaxial images at the same level
where it measured 9.2 x 11.0 cm previously.  S U V-max in this
region ranges from 2 to 3.5 compared to 25 previously.

The cardiophrenic lymph node seen previously has almost completely
resolved.

A small residual area of soft tissue is seen in the anterior right
costophrenic sulcus measuring.  This has an S U V-max of 3.

The patient has a focal opacification of the peripheral and
posterior right upper lobe.  This area shows S U V-max values in
the 2 to 3 range.

The right upper lobe is much better aerated than on the previous
study.  The irregular pleural thickening on the right has
decreased.  Right pleural effusion has resolved.  There is no
evidence for new or progressive disease on either PET imaging or on
the CT data.
IMPRESSION: Marked interval response to therapy as demonstrated by both
anatomic reduction in size of the neoplastic lesions and associated
decrease in hypermetabolic F D G uptake.

## 2011-05-19 ENCOUNTER — Encounter (HOSPITAL_COMMUNITY)
Admission: RE | Admit: 2011-05-19 | Discharge: 2011-05-19 | Disposition: A | Discharge status: Home / Self Care | Payer: BC Managed Care – PPO | Source: Ambulatory Visit | Attending: Oncology | Admitting: Oncology

## 2011-05-19 ENCOUNTER — Encounter (HOSPITAL_COMMUNITY): Payer: Self-pay

## 2011-05-19 DIAGNOSIS — D7389 Other diseases of spleen: Secondary | ICD-10-CM | POA: Insufficient documentation

## 2011-05-19 DIAGNOSIS — C8589 Other specified types of non-Hodgkin lymphoma, extranodal and solid organ sites: Secondary | ICD-10-CM | POA: Insufficient documentation

## 2011-05-19 DIAGNOSIS — C8590 Non-Hodgkin lymphoma, unspecified, unspecified site: Secondary | ICD-10-CM

## 2011-05-19 LAB — GLUCOSE, CAPILLARY: Glucose-Capillary: 97 mg/dL (ref 70–99)

## 2011-05-19 MED ORDER — IOHEXOL 300 MG/ML  SOLN
125.0000 mL | Freq: Once | INTRAMUSCULAR | Status: AC | PRN
  Administered 2011-05-19: 125 mL via INTRAVENOUS

## 2011-05-19 MED ORDER — FLUDEOXYGLUCOSE F - 18 (FDG) INJECTION
18.1000 | Freq: Once | INTRAVENOUS | Status: AC | PRN
  Administered 2011-05-19: 18.1 via INTRAVENOUS

## 2011-05-26 ENCOUNTER — Telehealth: Payer: Self-pay | Admitting: Oncology

## 2011-05-26 ENCOUNTER — Other Ambulatory Visit: Payer: Self-pay | Admitting: Oncology

## 2011-05-26 ENCOUNTER — Other Ambulatory Visit (HOSPITAL_BASED_OUTPATIENT_CLINIC_OR_DEPARTMENT_OTHER): Payer: BC Managed Care – PPO | Admitting: Lab

## 2011-05-26 ENCOUNTER — Ambulatory Visit (HOSPITAL_BASED_OUTPATIENT_CLINIC_OR_DEPARTMENT_OTHER): Payer: BC Managed Care – PPO | Admitting: Oncology

## 2011-05-26 VITALS — BP 140/87 | HR 86 | Temp 98.3°F | Ht 68.0 in | Wt 202.7 lb

## 2011-05-26 DIAGNOSIS — Z452 Encounter for adjustment and management of vascular access device: Secondary | ICD-10-CM

## 2011-05-26 DIAGNOSIS — C8589 Other specified types of non-Hodgkin lymphoma, extranodal and solid organ sites: Secondary | ICD-10-CM

## 2011-05-26 DIAGNOSIS — R222 Localized swelling, mass and lump, trunk: Secondary | ICD-10-CM

## 2011-05-26 LAB — COMPREHENSIVE METABOLIC PANEL
ALT: 25 U/L (ref 0–53)
CO2: 24 mEq/L (ref 19–32)
Chloride: 104 mEq/L (ref 96–112)
Potassium: 4.3 mEq/L (ref 3.5–5.3)
Sodium: 141 mEq/L (ref 135–145)
Total Bilirubin: 0.4 mg/dL (ref 0.3–1.2)
Total Protein: 6.6 g/dL (ref 6.0–8.3)

## 2011-05-26 LAB — CBC WITH DIFFERENTIAL/PLATELET
BASO%: 0.3 % (ref 0.0–2.0)
MCHC: 35.3 g/dL (ref 32.0–36.0)
MONO#: 0.4 10*3/uL (ref 0.1–0.9)
RBC: 4.89 10*6/uL (ref 4.20–5.82)
RDW: 12.6 % (ref 11.0–14.6)
WBC: 2.8 10*3/uL — ABNORMAL LOW (ref 4.0–10.3)
lymph#: 0.7 10*3/uL — ABNORMAL LOW (ref 0.9–3.3)

## 2011-05-26 LAB — LACTATE DEHYDROGENASE: LDH: 122 U/L (ref 94–250)

## 2011-05-26 NOTE — Progress Notes (Signed)
Hematology and Oncology Follow Up Visit  Hawthorne Day [id number] [birth year] 30 y.o. 05/26/2011 10:30 AM PCP dr Michel Bickers  Principle Diagnosis: :  A 30 year old [place] gentleman with a history of a stage II non-Hodgkin lymphoma with completion of 6 cycles of q.3 week Rituxan/CHOP on 05/27/2010, radiation under the care of Dr. Dayton Scrape, which was completed on 07/26/2010.  Interim History:  There have been no intercurrent illness, hospitalizations or medication changes.  Medications: I have reviewed the patient's current medications.  Allergies: No Known Allergies  Past Medical History, Surgical history, Social history, and Family History were reviewed and updated.  Review of Systems: Constitutional:  Negative for fever, chills, night sweats, anorexia, weight loss, pain. Cardiovascular: no chest pain or dyspnea on exertion Respiratory: no cough, shortness of breath, or wheezing Neurological: no TIA or stroke symptoms Dermatological: negative ENT: negative Skin Gastrointestinal: no abdominal pain, change in bowel habits, or black or bloody stools Genito-Urinary: no dysuria, trouble voiding, or hematuria Hematological and Lymphatic: negative Breast: negative Musculoskeletal: negative Remaining ROS negative.  Physical Exam: Blood pressure 140/87, pulse 86, temperature 98.3 F (36.8 C), height 5\' 8"  (1.727 m), weight 202 lb 11.2 oz (91.944 kg). ECOG:  General appearance: alert, cooperative and appears stated age Head: Normocephalic, without obvious abnormality, atraumatic Neck: no adenopathy, no carotid bruit, no JVD, supple, symmetrical, trachea midline and thyroid not enlarged, symmetric, no tenderness/mass/nodules Lymph nodes: Cervical, supraclavicular, and axillary nodes normal. Cardiac : regular rate and rhythm, no murmurs or gallops Pulmonary:clear to auscultation bilaterally and normal percussion bilaterally Breasts: negative Abdomen:soft, non-tender; bowel sounds normal;  no masses,  no organomegaly Extremities negative Neuro: alert, oriented, normal speech, no focal findings or movement disorder noted  Lab Results: Lab Results  Component Value Date   WBC 2.8* 05/26/2011   HGB 15.1 05/26/2011   HCT 42.8 05/26/2011   MCV 87.5 05/26/2011   PLT 170 05/26/2011     Chemistry      Component Value Date/Time   NA 141 02/24/2011 0933   K 4.4 02/24/2011 0933   CL 105 02/24/2011 0933   CO2 26 02/24/2011 0933   BUN 9 02/24/2011 0933   CREATININE 0.90 02/24/2011 0933      Component Value Date/Time   CALCIUM 9.9 02/24/2011 0933   ALKPHOS 59 02/24/2011 0933   AST 18 02/24/2011 0933   ALT 22 02/24/2011 0933   BILITOT 0.6 02/24/2011 0933      .pathology. Radiological Studies: chest X-ray N/a- recent pt/ct- NED Mammogram n/a Bone density n/a  Impression and Plan: Stratton is doing great; his scans are negative; his port is still in place and needs to be flushed. I will see him in 4 months. More than 50% of the visit was spent in patient-related counselling   Pierce Crane, MD 12/21/201210:30 AM

## 2011-05-26 NOTE — Telephone Encounter (Signed)
Gv pt appt for [id number]

## 2011-07-21 ENCOUNTER — Ambulatory Visit (HOSPITAL_BASED_OUTPATIENT_CLINIC_OR_DEPARTMENT_OTHER): Payer: BC Managed Care – PPO

## 2011-07-21 DIAGNOSIS — Z452 Encounter for adjustment and management of vascular access device: Secondary | ICD-10-CM

## 2011-07-21 DIAGNOSIS — C8589 Other specified types of non-Hodgkin lymphoma, extranodal and solid organ sites: Secondary | ICD-10-CM

## 2011-07-21 DIAGNOSIS — C8590 Non-Hodgkin lymphoma, unspecified, unspecified site: Secondary | ICD-10-CM

## 2011-07-21 MED ORDER — SODIUM CHLORIDE 0.9 % IJ SOLN
10.0000 mL | INTRAMUSCULAR | Status: DC | PRN
  Administered 2011-07-21: 10 mL via INTRAVENOUS
  Filled 2011-07-21: qty 10

## 2011-07-21 MED ORDER — HEPARIN SOD (PORK) LOCK FLUSH 100 UNIT/ML IV SOLN
500.0000 [IU] | Freq: Once | INTRAVENOUS | Status: AC
  Administered 2011-07-21: 500 [IU] via INTRAVENOUS
  Filled 2011-07-21: qty 5

## 2011-09-13 ENCOUNTER — Telehealth: Payer: Self-pay | Admitting: *Deleted

## 2011-09-13 NOTE — Telephone Encounter (Signed)
patient called in scheduled patient for a pet scan on 09-26-2011 at 8:00am gave patient appointment to see schere on 10-02-2011 starting at 11:15am

## 2011-09-15 ENCOUNTER — Other Ambulatory Visit: Payer: BC Managed Care – PPO | Admitting: Lab

## 2011-09-15 ENCOUNTER — Ambulatory Visit: Payer: BC Managed Care – PPO | Admitting: Oncology

## 2011-09-26 ENCOUNTER — Encounter (HOSPITAL_COMMUNITY)
Admission: RE | Admit: 2011-09-26 | Discharge: 2011-09-26 | Disposition: A | Discharge status: Home / Self Care | Payer: BC Managed Care – PPO | Source: Ambulatory Visit | Attending: Oncology | Admitting: Oncology

## 2011-09-26 DIAGNOSIS — C8589 Other specified types of non-Hodgkin lymphoma, extranodal and solid organ sites: Secondary | ICD-10-CM | POA: Insufficient documentation

## 2011-09-26 MED ORDER — FLUDEOXYGLUCOSE F - 18 (FDG) INJECTION
19.9000 | Freq: Once | INTRAVENOUS | Status: AC | PRN
  Administered 2011-09-26: 19.9 via INTRAVENOUS

## 2011-09-29 ENCOUNTER — Other Ambulatory Visit: Payer: Self-pay | Admitting: *Deleted

## 2011-09-29 DIAGNOSIS — C8589 Other specified types of non-Hodgkin lymphoma, extranodal and solid organ sites: Secondary | ICD-10-CM

## 2011-10-02 ENCOUNTER — Other Ambulatory Visit (HOSPITAL_BASED_OUTPATIENT_CLINIC_OR_DEPARTMENT_OTHER): Payer: BC Managed Care – PPO | Admitting: Lab

## 2011-10-02 ENCOUNTER — Encounter: Payer: Self-pay | Admitting: Physician Assistant

## 2011-10-02 ENCOUNTER — Ambulatory Visit (HOSPITAL_BASED_OUTPATIENT_CLINIC_OR_DEPARTMENT_OTHER): Payer: BC Managed Care – PPO | Admitting: Physician Assistant

## 2011-10-02 ENCOUNTER — Telehealth: Payer: Self-pay | Admitting: *Deleted

## 2011-10-02 VITALS — BP 135/84 | HR 81 | Temp 98.1°F | Ht 68.0 in | Wt 202.0 lb

## 2011-10-02 DIAGNOSIS — C8589 Other specified types of non-Hodgkin lymphoma, extranodal and solid organ sites: Secondary | ICD-10-CM

## 2011-10-02 LAB — LACTATE DEHYDROGENASE: LDH: 128 U/L (ref 94–250)

## 2011-10-02 LAB — CBC WITH DIFFERENTIAL/PLATELET
Basophils Absolute: 0 10*3/uL (ref 0.0–0.1)
Eosinophils Absolute: 0 10*3/uL (ref 0.0–0.5)
LYMPH%: 26 % (ref 14.0–49.0)
MCV: 87.6 fL (ref 79.3–98.0)
MONO%: 9.7 % (ref 0.0–14.0)
NEUT#: 1.9 10*3/uL (ref 1.5–6.5)
Platelets: 165 10*3/uL (ref 140–400)
RBC: 4.94 10*6/uL (ref 4.20–5.82)
nRBC: 0 % (ref 0–0)

## 2011-10-02 LAB — COMPREHENSIVE METABOLIC PANEL
BUN: 12 mg/dL (ref 6–23)
CO2: 26 mEq/L (ref 19–32)
Creatinine, Ser: 0.92 mg/dL (ref 0.50–1.35)
Glucose, Bld: 102 mg/dL — ABNORMAL HIGH (ref 70–99)
Total Bilirubin: 0.4 mg/dL (ref 0.3–1.2)

## 2011-10-02 NOTE — Telephone Encounter (Signed)
per tina from [facility] spoke with patient and set up to have the port removed in 11-2011 gave patient appointment for 01-04-2012 for lab and pet scan printed out calendar and gave to the patient

## 2011-10-02 NOTE — Progress Notes (Signed)
Hematology and Oncology Follow Up Visit  William Mercado [id number] 1981/01/09 31 y.o. 10/02/2011    HPI: William Mercado is a 31 year old [place] gentleman with a history of a stage IIA non-Hodgkin's lymphoma, for which he completed 6 cycles of every 3 week Rituxan/CHOP on 05/27/2010. Radiation therapy under the care of Dr. Dayton Scrape completed on 07/26/2010.  Now on observation alone.  Interim History:   William Mercado is seen today for routine follow pertain to his history of a stage IIA non-Hodgkin's lymphoma. PET scan was performed on 09/26/2011. He is feeling well, denying any unexplained fevers, chills, or night sweats. No shortness of breath, or chest pain. No nausea, emesis, diarrhea, or constipation issues. His appetite has been excellent, he denies early satiety. He denies any peripheral neuropathy. No headaches or vision changes.  He wonders about the possibility of having his port removed. It was placed by Dr. Fredia Sorrow in interventional radiology prior to initiation of chemotherapy. A detailed review of systems is otherwise noncontributory as noted below.  Review of Systems: Constitutional:  no weight loss, fever, night sweats and feels well Eyes: uses glasses ENT: no complaints Cardiovascular: no chest pain or dyspnea on exertion Respiratory: no cough, shortness of breath, or wheezing Neurological: no TIA or stroke symptoms Dermatological: negative Gastrointestinal: no abdominal pain, change in bowel habits, or black or bloody stools Genito-Urinary: no dysuria, trouble voiding, or hematuria Hematological and Lymphatic: negative Breast: negative Musculoskeletal: negative Remaining ROS negative.  Medications:   I have reviewed the patient's current medications.  Current Outpatient Prescriptions  Medication Sig Dispense Refill  . Multiple Vitamins-Minerals (MULTIVITAMIN WITH MINERALS) tablet Take 1 tablet by mouth daily.          Allergies: No Known Allergies  Physical  Exam: Filed Vitals:   10/02/11 1135  BP: 135/84  Pulse: 81  Temp: 98.1 F (36.7 C)    Body mass index is 30.71 kg/(m^2). Weight: 202 lbs. HEENT:  Sclerae anicteric, conjunctivae pink.  Oropharynx clear.  No mucositis or candidiasis.   Nodes:  No cervical, supraclavicular, or axillary lymphadenopathy palpated.  Lungs:  Clear to auscultation bilaterally.  No crackles, rhonchi, or wheezes.   Heart:  Regular rate and rhythm.   Abdomen:  Soft, nontender.  Positive bowel sounds.  No organomegaly or masses palpated.   Musculoskeletal:  No focal spinal tenderness to palpation.  Extremities:  Benign.  No peripheral edema or cyanosis.   Skin:  Benign.   Neuro:  Nonfocal, alert and oriented x 3.  Lab Results: Lab Results  Component Value Date   WBC 3.0* 10/02/2011   HGB 15.1 10/02/2011   HCT 43.3 10/02/2011   MCV 87.6 10/02/2011   PLT 165 10/02/2011   NEUTROABS 1.9 10/02/2011     Chemistry      Component Value Date/Time   NA 141 05/26/2011 0938   K 4.3 05/26/2011 0938   CL 104 05/26/2011 0938   CO2 24 05/26/2011 0938   BUN 10 05/26/2011 0938   CREATININE 1.01 05/26/2011 0938      Component Value Date/Time   CALCIUM 9.8 05/26/2011 0938   ALKPHOS 68 05/26/2011 0938   AST 18 05/26/2011 0938   ALT 25 05/26/2011 0938   BILITOT 0.4 05/26/2011 0938      No results found for this basename: LABCA2    Radiological Studies: Nm Pet Image Restag (ps) Skull Base To Thigh 09/26/2011  *RADIOLOGY REPORT*  Clinical Data: Restaging treatment strategy for non-Hodgkins lymphoma.  NUCLEAR MEDICINE PET SKULL BASE TO THIGH  Fasting Blood Glucose:  92  Technique:  19.9  mCi F-18 FDG was injected intravenously. CT data was obtained and used for attenuation correction and anatomic localization only.  (This was not acquired as a diagnostic CT examination.) Additional exam technical data entered on technologist worksheet.  Comparison:  05/19/2011  Findings:  Neck: No hypermetabolic lymph nodes in the neck.   Chest:  No hypermetabolic mediastinal or hilar nodes.  No suspicious pulmonary nodules on the CT scan. Similar appearance of persistent patchy ground-glass opacities in the right lung.  There is a mosaic attenuation pattern within the right lung and left lower lobe.  Scarring is noted within the right upper lobe.  Abdomen/Pelvis:  No abnormal hypermetabolic activity within the liver, pancreas, adrenal glands, or spleen.  No hypermetabolic lymph nodes in the abdomen or pelvis.  [place]:  There is an area of relative decreased radiotracer uptake involving the cervical and thoracic spine which likely corresponds to radiation port.  No abnormal areas of hypermetabolism noted within the axial and proper proximal appendicular skeleton to suggest residual tumor.  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  IMPRESSION:  1.  There are no specific features identified to suggest residual or recurrent tumor within the chest, abdomen or pelvis.  Original Report Authenticated By: Rosealee Albee, M.D.     Assessment:  William Mercado is a 31 year old [place] gentleman with a history of a stage II, non-Hodgkin's lymphoma, for which she completed 6 cycles of every 3 week Rituxan/CHOP on 05/27/2010. Radiation therapy under the care of Dr. Dayton Scrape completed on 07/26/2010.  Now on observation alone.  No evidence of recurrent disease.  Case and recent PET scan was reviewed with Dr. Pierce Crane.  Plan:  William Mercado is doing quite well.  We will refer him back to interventional radiology for port removal. We will plan on seeing him back in 4 months, prior to which a repeat PET scan will be obtained. At the time of his office visit a CBC, serum chemistries, LDH will be repeated. He knows to contact us prior to his 4 month followup if the need should arise.  This plan was reviewed with the patient, who voices understanding and agreement.  He knows to call with any changes or problems.    Valdis Bevill T,  PA-C 10/02/2011

## 2011-11-20 ENCOUNTER — Other Ambulatory Visit: Payer: Self-pay | Admitting: Radiology

## 2011-11-23 ENCOUNTER — Telehealth (HOSPITAL_COMMUNITY): Payer: Self-pay | Admitting: Oncology

## 2011-11-24 ENCOUNTER — Ambulatory Visit (HOSPITAL_COMMUNITY)
Admission: RE | Admit: 2011-11-24 | Discharge: 2011-11-24 | Disposition: A | Discharge status: Home / Self Care | Payer: BC Managed Care – PPO | Source: Ambulatory Visit | Attending: Physician Assistant | Admitting: Physician Assistant

## 2011-11-24 ENCOUNTER — Encounter (HOSPITAL_COMMUNITY): Payer: Self-pay

## 2011-11-24 VITALS — BP 118/78 | HR 71 | Temp 97.9°F | Resp 21

## 2011-11-24 DIAGNOSIS — C8589 Other specified types of non-Hodgkin lymphoma, extranodal and solid organ sites: Secondary | ICD-10-CM

## 2011-11-24 DIAGNOSIS — Z452 Encounter for adjustment and management of vascular access device: Secondary | ICD-10-CM | POA: Insufficient documentation

## 2011-11-24 DIAGNOSIS — Z9221 Personal history of antineoplastic chemotherapy: Secondary | ICD-10-CM | POA: Insufficient documentation

## 2011-11-24 LAB — CBC
MCH: 30.4 pg (ref 26.0–34.0)
MCHC: 35.3 g/dL (ref 30.0–36.0)
MCV: 86.2 fL (ref 78.0–100.0)
Platelets: 171 10*3/uL (ref 150–400)
RDW: 12.5 % (ref 11.5–15.5)

## 2011-11-24 MED ORDER — FENTANYL CITRATE 0.05 MG/ML IJ SOLN
INTRAMUSCULAR | Status: AC
  Filled 2011-11-24: qty 2

## 2011-11-24 MED ORDER — LIDOCAINE HCL 1 % IJ SOLN
INTRAMUSCULAR | Status: AC
  Filled 2011-11-24: qty 20

## 2011-11-24 MED ORDER — MIDAZOLAM HCL 5 MG/5ML IJ SOLN
INTRAMUSCULAR | Status: AC | PRN
  Administered 2011-11-24 (×2): 2 mg via INTRAVENOUS

## 2011-11-24 MED ORDER — CEFAZOLIN SODIUM-DEXTROSE 2-3 GM-% IV SOLR
2.0000 g | Freq: Once | INTRAVENOUS | Status: AC
  Administered 2011-11-24: 2 g via INTRAVENOUS

## 2011-11-24 MED ORDER — FENTANYL CITRATE 0.05 MG/ML IJ SOLN
INTRAMUSCULAR | Status: AC | PRN
  Administered 2011-11-24: 100 ug via INTRAVENOUS

## 2011-11-24 MED ORDER — CEFAZOLIN SODIUM-DEXTROSE 2-3 GM-% IV SOLR
INTRAVENOUS | Status: AC
  Filled 2011-11-24: qty 50

## 2011-11-24 MED ORDER — MIDAZOLAM HCL 2 MG/2ML IJ SOLN
INTRAMUSCULAR | Status: AC
  Filled 2011-11-24: qty 4

## 2011-11-24 MED ORDER — SODIUM CHLORIDE 0.9 % IV SOLN
Freq: Once | INTRAVENOUS | Status: AC
  Administered 2011-11-24: 08:00:00 via INTRAVENOUS

## 2011-11-24 NOTE — Procedures (Signed)
Procedure:  Right chest port removal Uncomplicated port removal.

## 2011-11-24 NOTE — H&P (Signed)
Agree 

## 2011-11-24 NOTE — H&P (Signed)
Chief Complaint: Lymphoma Referring Physician:Rubin HPI: William Mercado is an 31 y.o. male with hx of non-hodgkin's lymphoma and has completed his chemotherapy. He needs his port out. Otherwise healthy with no c/o.  Past Medical History:  Past Medical History  Diagnosis Date  . Cancer     nhl    Past Surgical History: No past surgical history on file.  Family History: No family history on file.  Social History:  does not have a smoking history on file. He does not have any smokeless tobacco history on file. His alcohol and drug histories not on file.  Allergies: No Known Allergies  Medications: MVI  Please HPI for pertinent positives, otherwise complete 10 system ROS negative.  Physical Exam: Blood pressure 131/75, pulse 69, temperature 97.9 F (36.6 C), resp. rate 21, SpO2 95.00%. There is no height or weight on file to calculate BMI.   General Appearance:  Alert, cooperative, no distress, appears stated age  Head:  Normocephalic, without obvious abnormality, atraumatic  ENT: Unremarkable  Neck: Supple, symmetrical, trachea midline, no adenopathy, thyroid: not enlarged, symmetric, no tenderness/mass/nodules  Lungs:   Clear to auscultation bilaterally, no w/r/r, respirations unlabored without use of accessory muscles.  Chest Wall:  No tenderness or deformity. Palp rt chest port, no issues.  Heart:  Regular rate and rhythm, S1, S2 normal, no murmur, rub or gallop. Carotids 2+ without bruit.  Abdomen:   Soft, non-tender, non distended. Bowel sounds active all four quadrants,  no masses, no organomegaly.  Extremities: Extremities normal, atraumatic, no cyanosis or edema  Neurologic: Normal affect, no gross deficits.   No results found for this or any previous visit (from the past 48 hour(s)). No results found.  Assessment/Plan Non-Hodgkin's lymphoma Port removal procedure discussed including risks. Consent signed in chart.  Brayton El PA-C 11/24/2011, 7:39 AM

## 2012-01-04 ENCOUNTER — Other Ambulatory Visit (HOSPITAL_BASED_OUTPATIENT_CLINIC_OR_DEPARTMENT_OTHER): Payer: BC Managed Care – PPO | Admitting: Lab

## 2012-01-04 ENCOUNTER — Encounter (HOSPITAL_COMMUNITY)
Admission: RE | Admit: 2012-01-04 | Discharge: 2012-01-04 | Disposition: A | Discharge status: Home / Self Care | Payer: BC Managed Care – PPO | Source: Ambulatory Visit | Attending: Physician Assistant | Admitting: Physician Assistant

## 2012-01-04 DIAGNOSIS — C8589 Other specified types of non-Hodgkin lymphoma, extranodal and solid organ sites: Secondary | ICD-10-CM | POA: Insufficient documentation

## 2012-01-04 LAB — CBC WITH DIFFERENTIAL/PLATELET
BASO%: 0.4 % (ref 0.0–2.0)
EOS%: 3.2 % (ref 0.0–7.0)
HCT: 44.5 % (ref 38.4–49.9)
MCH: 30.6 pg (ref 27.2–33.4)
MCHC: 34.3 g/dL (ref 32.0–36.0)
NEUT%: 60.1 % (ref 39.0–75.0)
RBC: 4.98 10*6/uL (ref 4.20–5.82)
RDW: 12.6 % (ref 11.0–14.6)
lymph#: 0.7 10*3/uL — ABNORMAL LOW (ref 0.9–3.3)

## 2012-01-04 LAB — COMPREHENSIVE METABOLIC PANEL
ALT: 17 U/L (ref 0–53)
AST: 14 U/L (ref 0–37)
Calcium: 9.6 mg/dL (ref 8.4–10.5)
Chloride: 106 mEq/L (ref 96–112)
Creatinine, Ser: 1.04 mg/dL (ref 0.50–1.35)
Sodium: 141 mEq/L (ref 135–145)

## 2012-01-04 MED ORDER — FLUDEOXYGLUCOSE F - 18 (FDG) INJECTION
14.8000 | Freq: Once | INTRAVENOUS | Status: AC | PRN
  Administered 2012-01-04: 14.8 via INTRAVENOUS

## 2012-01-16 ENCOUNTER — Telehealth: Payer: Self-pay | Admitting: *Deleted

## 2012-01-16 NOTE — Telephone Encounter (Signed)
patient called to reschedule appointment for 01-23-2012 at 8:45am

## 2012-01-19 ENCOUNTER — Ambulatory Visit: Payer: BC Managed Care – PPO | Admitting: Family

## 2012-01-22 ENCOUNTER — Encounter: Payer: Self-pay | Admitting: *Deleted

## 2012-01-22 ENCOUNTER — Telehealth: Payer: Self-pay | Admitting: *Deleted

## 2012-01-22 NOTE — Telephone Encounter (Signed)
Patient is aware of the change to 01-25-2012 at 8:45am

## 2012-01-23 ENCOUNTER — Ambulatory Visit: Payer: BC Managed Care – PPO | Admitting: Family

## 2012-01-25 ENCOUNTER — Telehealth: Payer: Self-pay | Admitting: *Deleted

## 2012-01-25 ENCOUNTER — Encounter: Payer: Self-pay | Admitting: Family

## 2012-01-25 ENCOUNTER — Ambulatory Visit (HOSPITAL_BASED_OUTPATIENT_CLINIC_OR_DEPARTMENT_OTHER): Payer: BC Managed Care – PPO | Admitting: Family

## 2012-01-25 VITALS — BP 116/81 | HR 79 | Temp 97.6°F | Resp 20 | Ht 68.0 in | Wt 202.5 lb

## 2012-01-25 DIAGNOSIS — C8590 Non-Hodgkin lymphoma, unspecified, unspecified site: Secondary | ICD-10-CM

## 2012-01-25 DIAGNOSIS — C8589 Other specified types of non-Hodgkin lymphoma, extranodal and solid organ sites: Secondary | ICD-10-CM

## 2012-01-25 NOTE — Telephone Encounter (Signed)
Gave patient appointment for lab and pet scan 07-02-2012 gave patient appointment for 07-09-2012 at 9:00am with md

## 2012-01-25 NOTE — Patient Instructions (Signed)
Return in 6 months to see Dr. Donnie Coffin with PET scan prior.

## 2012-01-25 NOTE — Progress Notes (Signed)
Hematology and Oncology Follow Up Visit  Oneill Bais [id number] Oct 31, 1980 31 y.o. 01/25/2012    HPI: Markeem is a 31 year old [place] gentleman with a history of a stage IIA non-Hodgkin's lymphoma, for which he completed 6 cycles of every 3 week Rituxan/CHOP on 05/27/2010. Radiation therapy under the care of Dr. Dayton Scrape completed on 07/26/2010.  Now on observation alone.  Interim History:   Layman is seen today for routine follow and results of PET scan done 01/04/12. PET shows no evidence of recurrent lymphoma. He feels well, denying any unexplained fevers, chills, or night sweats. No shortness of breath, or chest pain. No nausea, emesis, diarrhea, or constipation. Appetite and energy level have been excellent. No unexplained weight loss. No peripheral neuropathy. No headaches or vision changes.  Port has been removed uneventfully since last visit. A detailed review of systems is otherwise noncontributory.  Medications:   I have reviewed the patient's current medications.  Current Outpatient Prescriptions  Medication Sig Dispense Refill  . Multiple Vitamins-Minerals (MULTIVITAMIN WITH MINERALS) tablet Take 1 tablet by mouth daily.          Allergies: No Known Allergies  Physical Exam: Filed Vitals:   01/25/12 0844  BP: 116/81  Pulse: 79  Temp: 97.6 F (36.4 C)  Resp: 20    Body mass index is 30.79 kg/(m^2). Weight: 202 lbs. HEENT:  Sclerae anicteric, conjunctivae pink.  Oropharynx clear.  No mucositis or candidiasis.   Lungs: Clear to auscultation bilaterally.  Nodes:  No cervical, supraclavicular, or axillary lymphadenopathy palpated.  Lungs:  Clear to auscultation bilaterally.  No crackles, rhonchi, or wheezes.   Heart:  Regular rate and rhythm.   Abdomen:  Soft, nontender.  Positive bowel sounds.  No organomegaly or masses palpated.   Musculoskeletal:  No focal spinal tenderness to palpation.  Extremities:  Benign.  No peripheral edema or cyanosis.   Neuro:   Nonfocal, alert and oriented x 3.  Lab Results: Lab Results  Component Value Date   WBC 3.0* 01/04/2012   HGB 15.2 01/04/2012   HCT 44.5 01/04/2012   MCV 89.4 01/04/2012   PLT 178 01/04/2012   NEUTROABS 1.8 01/04/2012     Chemistry      Component Value Date/Time   NA 141 01/04/2012 0722   K 4.5 01/04/2012 0722   CL 106 01/04/2012 0722   CO2 28 01/04/2012 0722   BUN 14 01/04/2012 0722   CREATININE 1.04 01/04/2012 0722      Component Value Date/Time   CALCIUM 9.6 01/04/2012 0722   ALKPHOS 54 01/04/2012 0722   AST 14 01/04/2012 0722   ALT 17 01/04/2012 0722   BILITOT 0.5 01/04/2012 7829        Assessment:  Gillis is a 31 year old [place] gentleman with a history of a stage II, non-Hodgkin's lymphoma, for which she completed 6 cycles of every 3 week Rituxan/CHOP on 05/27/2010. Radiation therapy under the care of Dr. Dayton Scrape completed on 07/26/2010.  Now on observation alone.  No evidence of recurrent disease.  Case and recent PET scan was reviewed with Dr. Pierce Crane.  Plan:  1. Return in 6 months to see Dr. Donnie Coffin with lab and PET scan prior.   Plan was discussed with Dr. Donnie Coffin and reflects his instructions.   This plan was reviewed with the patient, who voices understanding and agreement.  He knows to call with any changes or problems.    Colman Cater, FNP 01/25/2012

## 2012-07-02 ENCOUNTER — Other Ambulatory Visit: Payer: BC Managed Care – PPO | Admitting: Lab

## 2012-07-02 ENCOUNTER — Encounter (HOSPITAL_COMMUNITY)
Admission: RE | Admit: 2012-07-02 | Discharge: 2012-07-02 | Disposition: A | Discharge status: Home / Self Care | Payer: BC Managed Care – PPO | Source: Ambulatory Visit | Attending: Family | Admitting: Family

## 2012-07-02 ENCOUNTER — Other Ambulatory Visit: Payer: Self-pay | Admitting: Oncology

## 2012-07-02 DIAGNOSIS — C8590 Non-Hodgkin lymphoma, unspecified, unspecified site: Secondary | ICD-10-CM

## 2012-07-02 DIAGNOSIS — C8589 Other specified types of non-Hodgkin lymphoma, extranodal and solid organ sites: Secondary | ICD-10-CM | POA: Insufficient documentation

## 2012-07-02 DIAGNOSIS — J984 Other disorders of lung: Secondary | ICD-10-CM | POA: Insufficient documentation

## 2012-07-02 MED ORDER — FLUDEOXYGLUCOSE F - 18 (FDG) INJECTION
17.1000 | Freq: Once | INTRAVENOUS | Status: AC | PRN
  Administered 2012-07-02: 17.1 via INTRAVENOUS

## 2012-07-03 ENCOUNTER — Telehealth: Payer: Self-pay | Admitting: Oncology

## 2012-07-03 NOTE — Telephone Encounter (Signed)
Per Ottis Stain he can see pt 2/4 @ 9:30am. lmonvm for pt today re new d/t/provider and asked that pt call me ASAP to confirm. Given my name and direct #.

## 2012-07-09 ENCOUNTER — Ambulatory Visit (HOSPITAL_COMMUNITY)
Admission: RE | Admit: 2012-07-09 | Discharge: 2012-07-09 | Disposition: A | Discharge status: Home / Self Care | Payer: BC Managed Care – PPO | Source: Ambulatory Visit | Attending: Oncology | Admitting: Oncology

## 2012-07-09 ENCOUNTER — Telehealth: Payer: Self-pay | Admitting: Oncology

## 2012-07-09 ENCOUNTER — Ambulatory Visit (HOSPITAL_BASED_OUTPATIENT_CLINIC_OR_DEPARTMENT_OTHER): Payer: BC Managed Care – PPO | Admitting: Oncology

## 2012-07-09 ENCOUNTER — Ambulatory Visit: Payer: BC Managed Care – PPO | Admitting: Oncology

## 2012-07-09 VITALS — BP 139/82 | HR 107 | Temp 97.9°F | Resp 20 | Ht 68.0 in | Wt 203.6 lb

## 2012-07-09 DIAGNOSIS — Z9221 Personal history of antineoplastic chemotherapy: Secondary | ICD-10-CM | POA: Insufficient documentation

## 2012-07-09 DIAGNOSIS — C8589 Other specified types of non-Hodgkin lymphoma, extranodal and solid organ sites: Secondary | ICD-10-CM

## 2012-07-09 DIAGNOSIS — C8190 Hodgkin lymphoma, unspecified, unspecified site: Secondary | ICD-10-CM | POA: Insufficient documentation

## 2012-07-09 DIAGNOSIS — Z09 Encounter for follow-up examination after completed treatment for conditions other than malignant neoplasm: Secondary | ICD-10-CM | POA: Insufficient documentation

## 2012-07-09 NOTE — Patient Instructions (Signed)
Chest X ray today then once a year Echocardiogram - 1 time only to be scheduled See you in 1 year  Lab and X-ray same day as visit 07/07/13

## 2012-07-09 NOTE — Telephone Encounter (Signed)
gv and printed pt appt schedule for Feb 2014 and 2015...sent pt to radiology...gv orders to Mrs. Bonita Quin for Echo

## 2012-07-09 NOTE — Progress Notes (Signed)
Hematology and Oncology Follow Up Visit  William Mercado [id number] 10/22/1980 31 y.o. 07/09/2012 5:37 PM   Principle Diagnosis: Encounter Diagnosis  Name Primary?  William Mercado LYMPHOMA Yes     Interim History:  I will be assuming the hematology oncology care for this patient since Dr. Donnie Coffin left the practice.  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 He began to experience an intermittent catching sensation in his right chest occurring at 2-3 month intervals. This became more frequent and he then started to get a stabbing right chest pain and back pain. He developed an isolated episode of hemoptysis prompting further evaluation. He was found to have a huge right mediastinal lymph node mass with associated large right pleural effusion and was admitted to the hospital on an urgent basis for further evaluation on 01/27/2010. A CT guided biopsy was done on August 26. Finding showed a poorly differentiated lymphoproliferative process staining positive for B cell markers CD20 and CD 79a the pathologist's differential diagnosis was diffuse large B-cell lymphoma versus high-grade lymphoma toward granulomatosis. Attempts at flow cytometry of this tissue were nondiagnostic and no monoclonal population of B. or T cells were seen. A bone marrow aspiration and biopsy were negative. A PET scan done 02/10/2010 showed a large hypermetabolic mass centered around the right hilar region measuring about 8.5 x 11 x 10 cm with maximum SUV 24.7. There was significant mass effect on the right mainstem bronchus resulting in postobstructive atelectasis and consolidation of the right lung. There was nodular interlobular septal thickening within the right upper lobe with associated mild to moderate increase in metabolic uptake suggestive of lymphatic spread of tumor. There were multiple hypermetabolic pre-vascular lymph  nodes, posterior mediastinal, paraesophageal, and cardiophrenic lymph nodes. Spleen normal size. No abnormal uptake in the liver. No abnormal uptake in abdominal or pelvic lymph nodes. There was a loculated right pleural effusion and a small pericardial effusion.  He was treated with 6 cycles of CHOP Rituxan from September 2011 through 05/27/2010 and then received consolidative radiation by Dr. Chipper Herb. He received 3470 cGy in 21 fractions between January 24 and 07/26/2010.  He achieved acomplete response. He has been followed by serial CT scans and PET scans. He had a PET scan in anticipation of today's visit on 07/02/2012  and I have personally reviewed the images. He does have uptake in  Waldeyer's ring but reports that he has had an upper respiratory tract infection for the last 2 weeks with a sore throat and nasal congestion. There is no abnormal uptake outside of this area.  He reports no sequelae I to the chemotherapy and radiation. He denies any dyspnea, chest pain, palpitations. No paresthesias. No erectile dysfunction.   Medications: reviewed  Allergies: No Known Allergies  Review of Systems: Constitutional:   No constitutional symptoms Respiratory: See above Cardiovascular:  See above Gastrointestinal: Normal bowel habit Genito-Urinary: No urinary tract symptoms Musculoskeletal: No muscle bone or joint pain Neurologic: No headache or change in vision, no paresthesias Skin: No rash or ecchymosis Remaining ROS negative.  Physical Exam: Blood pressure 139/82, pulse 107, temperature 97.9 F (36.6 C), temperature source Oral, resp. rate 20, height 5\' 8"  (1.727 m), weight 203 lb 9.6 oz (92.352 kg). Wt Readings from Last 3 Encounters:  07/09/12 203 lb 9.6 oz (92.352 kg)  01/25/12 202 lb 8 oz (91.853 kg)  10/02/11 202 lb (91.627 kg)     General  appearance: Affable well-nourished African American man HENNT: Pharynx no erythema or exudate Lymph nodes: No cervical,  supraclavicular, or axillary adenopathy Breasts: Lungs: Clear to auscultation resonant to percussion Heart: Regular rhythm no murmur Abdomen: Soft, nontender, no mass, no organomegaly Extremities: No edema, no calf tenderness Vascular: No cyanosis Neurologic: Motor strength 5 over 5, reflexes absent symmetric at the knees, 1+ symmetric at the biceps, sensation intact to vibration by tuning fork exam over the fingertips Skin: No rash or ecchymosis  Lab Results: Lab Results  Component Value Date   WBC 3.0* 01/04/2012   HGB 15.2 01/04/2012   HCT 44.5 01/04/2012   MCV 89.4 01/04/2012   PLT 178 01/04/2012     Chemistry      Component Value Date/Time   NA 141 01/04/2012 0722   K 4.5 01/04/2012 0722   CL 106 01/04/2012 0722   CO2 28 01/04/2012 0722   BUN 14 01/04/2012 0722   CREATININE 1.04 01/04/2012 0722      Component Value Date/Time   CALCIUM 9.6 01/04/2012 0722   ALKPHOS 54 01/04/2012 0722   AST 14 01/04/2012 0722   ALT 17 01/04/2012 0722   BILITOT 0.5 01/04/2012 4782       Radiological Studies: Dg Chest 2 View  07/09/2012  *RADIOLOGY REPORT*  Clinical Data: History of Hodgkin's lymphoma, follow-up, chemotherapy 3 years ago  CHEST - 2 VIEW  Comparison: Chest x-ray of 02/17/2010 and CT chest of 05/19/2011  Findings: Mild scarring is noted anteriorly on the lateral view. No infiltrate or effusion is currently seen.  Mediastinal contours are stable with no adenopathy noted.  The heart is within normal limits in size.  No skeletal abnormality is seen.  IMPRESSION: No active lung disease.   Original Report Authenticated By: Dwyane Dee, M.D.    Nm Pet Image Restag (ps) Skull Base To Thigh  07/02/2012  *RADIOLOGY REPORT*  Clinical Data: Subsequent treatment strategy for lymphoma. Stage II non-Hodgkins lymphoma status post six cycles of Rituxan/CHOP completed 05/27/2010.  NUCLEAR MEDICINE PET SKULL BASE TO THIGH  Fasting Blood Glucose:  101  Technique:  17.1 mCi F-18 FDG was injected intravenously. CT data was  obtained and used for attenuation correction and anatomic localization only.  (This was not acquired as a diagnostic CT examination.) Additional exam technical data entered on technologist worksheet.  Comparison:  PET CT 01/04/2012.  Findings:  Neck: There is prominent symmetric hypermetabolic activity throughout the lymphoid tissue in Waldeyer's ring.  Most of this is similar to the prior study.  However, there is increased activity within the nasopharynx bilaterally (SUV max13.0).  No focal lesion is apparent on the CT images.  The mastoids and middle ears are clear bilaterally.  There are no hypermetabolic cervical lymph nodes.  Chest:  There are no hypermetabolic mediastinal, hilar or axillary lymph nodes.  There is no abnormal pulmonary metabolic activity. Scattered pulmonary parenchymal scarring and mosaic attenuation are again noted, unchanged.  Abdomen/Pelvis:  No abnormal hypermetabolic activity within the liver, pancreas, adrenal glands, or spleen.  No hypermetabolic lymph nodes in the abdomen or pelvis.  Skeleton:  No focal hypermetabolic activity to suggest skeletal metastasis.  IMPRESSION:  1.  Interval increase in metabolic activity within the lymphoid tissue of Waldeyer's ring, especially within the nasopharynx.  This is nonspecific and could be inflammatory.  Correlate clinically. 2.  No hypermetabolic nodal activity seen within the neck, chest, abdomen or pelvis. 3.  Stable pulmonary parenchymal scarring.   Original Report Authenticated By: Carey Bullocks, M.D.  Impression and Plan: #1. Stage IIB high-grade B-cell non-Hodgkin's lymphoma treated as outlined above. He remains free of any obvious recurrence now out 29 months from treatment. Plan: I'm going to schedule a echocardiogram to assess ejection fraction in view of previous anthracycline chemotherapy.  I'm going to see him again in one year with exam, lab chest x-ray. I will not get routine CT scans unless otherwise indicated by new  clinical symptoms or physical findings. I will get an annual chest x-ray in view of the possibility of second primary malignancy related to history of meds.  #2. Nonspecific metabolic uptake in lymph nodes in Waldeyer's ring on current PET scan. I believe this is related to a current upper respiratory tract infection with pharyngitis and nasal congestion.   CC:.  Dr. Chipper Herb   Levert Feinstein, MD 2/4/20145:37 PM

## 2012-07-12 ENCOUNTER — Ambulatory Visit (HOSPITAL_COMMUNITY)
Admission: RE | Admit: 2012-07-12 | Discharge: 2012-07-12 | Disposition: A | Discharge status: Home / Self Care | Payer: BC Managed Care – PPO | Source: Ambulatory Visit | Attending: Oncology | Admitting: Oncology

## 2012-07-12 DIAGNOSIS — Z09 Encounter for follow-up examination after completed treatment for conditions other than malignant neoplasm: Secondary | ICD-10-CM

## 2012-07-12 DIAGNOSIS — Z9221 Personal history of antineoplastic chemotherapy: Secondary | ICD-10-CM | POA: Insufficient documentation

## 2012-07-12 DIAGNOSIS — C8589 Other specified types of non-Hodgkin lymphoma, extranodal and solid organ sites: Secondary | ICD-10-CM | POA: Insufficient documentation

## 2012-07-12 NOTE — Progress Notes (Signed)
  Echocardiogram 2D Echocardiogram has been performed.  William Mercado 07/12/2012, 10:15 AM

## 2012-07-15 ENCOUNTER — Telehealth: Payer: Self-pay | Admitting: *Deleted

## 2012-07-15 NOTE — Telephone Encounter (Signed)
Message copied by Gala Romney on Mon Jul 15, 2012  3:02 PM ------      Message from: Levert Feinstein      Created: Tue Jul 09, 2012  7:45 PM       Call pt CXR normal ------

## 2012-07-15 NOTE — Telephone Encounter (Signed)
Spoke with pt again and let him know CXR was also normal.  Pt verbalized understanding.

## 2012-07-15 NOTE — Telephone Encounter (Signed)
Spoke with pt per Dr. Cyndie Chime; informed echocardiogram was normal.  Pt verbalized understanding.

## 2012-07-15 NOTE — Telephone Encounter (Signed)
Message copied by Gala Romney on Mon Jul 15, 2012  2:59 PM ------      Message from: Levert Feinstein      Created: Fri Jul 12, 2012  7:17 PM       Call pt echocardiogram normal ------

## 2012-11-22 NOTE — Telephone Encounter (Signed)
e

## 2013-07-02 ENCOUNTER — Telehealth: Payer: Self-pay | Admitting: *Deleted

## 2013-07-02 NOTE — Telephone Encounter (Signed)
Lm informed the pt that G has death in the family. gv appt for 07/21/13 w/ labs@ 3pm and ov@ 3:30pm. Made the pt aware that i will mail a letter/avs...td

## 2013-07-08 ENCOUNTER — Other Ambulatory Visit: Payer: BC Managed Care – PPO

## 2013-07-08 ENCOUNTER — Ambulatory Visit: Payer: BC Managed Care – PPO | Admitting: Oncology

## 2013-07-21 ENCOUNTER — Ambulatory Visit (HOSPITAL_BASED_OUTPATIENT_CLINIC_OR_DEPARTMENT_OTHER): Payer: 59 | Admitting: Oncology

## 2013-07-21 ENCOUNTER — Other Ambulatory Visit (HOSPITAL_BASED_OUTPATIENT_CLINIC_OR_DEPARTMENT_OTHER): Payer: 59

## 2013-07-21 ENCOUNTER — Telehealth: Payer: Self-pay | Admitting: Oncology

## 2013-07-21 VITALS — BP 151/96 | HR 98 | Temp 97.6°F | Resp 18 | Ht 68.0 in | Wt 210.7 lb

## 2013-07-21 DIAGNOSIS — C8582 Other specified types of non-Hodgkin lymphoma, intrathoracic lymph nodes: Secondary | ICD-10-CM

## 2013-07-21 DIAGNOSIS — C8589 Other specified types of non-Hodgkin lymphoma, extranodal and solid organ sites: Secondary | ICD-10-CM

## 2013-07-21 LAB — COMPREHENSIVE METABOLIC PANEL (CC13)
ALT: 28 U/L (ref 0–55)
ANION GAP: 11 meq/L (ref 3–11)
AST: 16 U/L (ref 5–34)
Albumin: 4.5 g/dL (ref 3.5–5.0)
Alkaline Phosphatase: 67 U/L (ref 40–150)
BUN: 10.6 mg/dL (ref 7.0–26.0)
CALCIUM: 10.3 mg/dL (ref 8.4–10.4)
CHLORIDE: 105 meq/L (ref 98–109)
CO2: 29 meq/L (ref 22–29)
CREATININE: 1 mg/dL (ref 0.7–1.3)
Glucose: 96 mg/dl (ref 70–140)
Potassium: 3.9 mEq/L (ref 3.5–5.1)
Sodium: 144 mEq/L (ref 136–145)
Total Bilirubin: 0.33 mg/dL (ref 0.20–1.20)
Total Protein: 7.5 g/dL (ref 6.4–8.3)

## 2013-07-21 LAB — CBC WITH DIFFERENTIAL/PLATELET
BASO%: 0.5 % (ref 0.0–2.0)
Basophils Absolute: 0 10e3/uL (ref 0.0–0.1)
EOS%: 1.5 % (ref 0.0–7.0)
Eosinophils Absolute: 0.1 10e3/uL (ref 0.0–0.5)
HCT: 46.6 % (ref 38.4–49.9)
HGB: 15.6 g/dL (ref 13.0–17.1)
LYMPH%: 30.2 % (ref 14.0–49.0)
MCH: 29.7 pg (ref 27.2–33.4)
MCHC: 33.5 g/dL (ref 32.0–36.0)
MCV: 88.7 fL (ref 79.3–98.0)
MONO#: 0.4 10e3/uL (ref 0.1–0.9)
MONO%: 9.4 % (ref 0.0–14.0)
NEUT#: 2.7 10e3/uL (ref 1.5–6.5)
NEUT%: 58.4 % (ref 39.0–75.0)
Platelets: 200 10e3/uL (ref 140–400)
RBC: 5.25 10e6/uL (ref 4.20–5.82)
RDW: 13.1 % (ref 11.0–14.6)
WBC: 4.6 10e3/uL (ref 4.0–10.3)
lymph#: 1.4 10e3/uL (ref 0.9–3.3)

## 2013-07-21 LAB — LACTATE DEHYDROGENASE (CC13): LDH: 154 U/L (ref 125–245)

## 2013-07-21 NOTE — Telephone Encounter (Signed)
gv and aprinted appt sched and avs for pt for Feb 2016...Marland Kitchenpt aware to go to radiology within a wk no appt needed.

## 2013-07-21 NOTE — Progress Notes (Signed)
Hematology and Oncology Follow Up Visit  William Mercado [id number] 11/30/1980 33 y.o. 07/21/2013 6:06 PM   Principle Diagnosis: Encounter Diagnosis  Name Primary?  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 He initially developed symptoms in the spring of 2011. He began to experience an intermittent catching sensation in his right chest occurring at 2-3 month intervals. This became more frequent and he then started to get a stabbing right chest pain and back pain. He developed an isolated episode of hemoptysis prompting further evaluation. He was found to have a huge right mediastinal lymph node mass with associated large right pleural effusion and was admitted to the hospital on an urgent basis for further evaluation on 01/27/2010. A CT guided biopsy was done on August 26. Finding showed a poorly differentiated lymphoproliferative process staining positive for B cell markers CD20 and CD 79a the pathologist's differential diagnosis was diffuse large B-cell lymphoma versus high-grade lymphoma toward granulomatosis. Attempts at flow cytometry of this tissue were nondiagnostic and no monoclonal population of B. or T cells were seen. A bone marrow aspiration and biopsy were negative. A PET scan done 02/10/2010 showed a large hypermetabolic mass centered around the right hilar region measuring about 8.5 x 11 x 10 cm with maximum SUV 24.7. There was significant mass effect on the right mainstem bronchus resulting in postobstructive atelectasis and consolidation of the right lung. There was nodular interlobular septal thickening within the right upper lobe with associated mild to moderate increase in metabolic uptake suggestive of lymphatic spread of tumor. There were multiple hypermetabolic pre-vascular lymph nodes, posterior mediastinal, paraesophageal, and cardiophrenic lymph  nodes. Spleen normal size. No abnormal uptake in the liver. No abnormal uptake in abdominal or pelvic lymph nodes. There was a loculated right pleural effusion and a small pericardial effusion.  He was treated with 6 cycles of CHOP Rituxan from September 2011 through 05/27/2010 and then received consolidative radiation by Dr. Arloa Koh. He received 3470 cGy in 21 fractions between January 24 and 07/26/2010.  He achieved a complete PET response.  He continues to do well at this time. He denies any dyspnea, chest pain, palpitations.  Since his last visit, he has gotten engaged. He will get married later this year. He had some transient impotence while on the chemotherapy which has resolved completely. 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   Medications: reviewed  Allergies: No Known Allergies  Review of Systems: Hematology:  No bleeding or bruising ENT ROS:  Breast ROS:  Respiratory ROS: No cough or dyspnea Cardiovascular ROS:  No chest pain or palpitations Gastrointestinal ROS:  No abdominal pain or change in bowel habit  Genito-Urinary ROS: Full recovery of sexual function. Musculoskeletal ROS: No muscle bone or joint pain Neurological ROS: No headache or change in vision Dermatological ROS: No rash Remaining ROS negative:   Physical Exam: Blood pressure 151/96, pulse 98, temperature 97.6 F (36.4 C), temperature source Oral, resp. rate 18, height 5\' 8"  (1.727 m), weight 210 lb 11.2 oz (95.573 kg), SpO2 99.00%. Wt Readings from Last 3 Encounters:  07/21/13 210 lb 11.2 oz (95.573 kg)  07/09/12 203 lb 9.6 oz (92.352 kg)  01/25/12 202 lb 8 oz (91.853 kg)     General appearance: Pleasant, well-nourished, African American man HENNT: Pharynx no erythema, exudate, mass, or ulcer. No thyromegaly  or thyroid nodules Lymph nodes: No cervical, supraclavicular, or axillary lymphadenopathy Breasts:  Lungs: Clear to  auscultation, resonant to percussion throughout Heart: Regular rhythm, no murmur, no gallop, no rub, no click, no edema Abdomen: Soft, nontender, normal bowel sounds, no mass, no organomegaly Extremities: No edema, no calf tenderness Musculoskeletal: no joint deformities GU:  Vascular: Carotid pulses 2+, no bruits,  Neurologic: Alert, oriented, PERRLA,  cranial nerves grossly normal, motor strength 5 over 5, reflexes 1+ symmetric, upper body coordination normal, gait normal, Skin: No rash or ecchymosis  Lab Results: CBC W/Diff    Component Value Date/Time   WBC 4.6 07/21/2013 1518   WBC 3.5* 11/24/2011 0725   RBC 5.25 07/21/2013 1518   RBC 5.13 11/24/2011 0725   HGB 15.6 07/21/2013 1518   HGB 15.6 11/24/2011 0725   HCT 46.6 07/21/2013 1518   HCT 44.2 11/24/2011 0725   PLT 200 07/21/2013 1518   PLT 171 11/24/2011 0725   MCV 88.7 07/21/2013 1518   MCV 86.2 11/24/2011 0725   MCH 29.7 07/21/2013 1518   MCH 30.4 11/24/2011 0725   MCHC 33.5 07/21/2013 1518   MCHC 35.3 11/24/2011 0725   RDW 13.1 07/21/2013 1518   RDW 12.5 11/24/2011 0725   LYMPHSABS 1.4 07/21/2013 1518   LYMPHSABS 1.1 02/04/2010 0753   MONOABS 0.4 07/21/2013 1518   MONOABS 0.8 02/04/2010 0753   EOSABS 0.1 07/21/2013 1518   EOSABS 0.0 02/04/2010 0753   BASOSABS 0.0 07/21/2013 1518   BASOSABS 0.0 02/04/2010 0753     Chemistry      Component Value Date/Time   NA 144 07/21/2013 1518   NA 141 01/04/2012 0722   K 3.9 07/21/2013 1518   K 4.5 01/04/2012 0722   CL 106 01/04/2012 0722   CO2 29 07/21/2013 1518   CO2 28 01/04/2012 0722   BUN 10.6 07/21/2013 1518   BUN 14 01/04/2012 0722   CREATININE 1.0 07/21/2013 1518   CREATININE 1.04 01/04/2012 0722      Component Value Date/Time   CALCIUM 10.3 07/21/2013 1518   CALCIUM 9.6 01/04/2012 0722   ALKPHOS 67 07/21/2013 1518   ALKPHOS 54 01/04/2012 0722   AST 16 07/21/2013 1518   AST 14 01/04/2012 0722   ALT 28 07/21/2013 1518   ALT 17 01/04/2012 0722   BILITOT 0.33 07/21/2013 1518   BILITOT 0.5 01/04/2012 0722        Impression:  Stage IIB, high-grade, primary mediastinal B-cell, non-Hodgkin's lymphoma treated as outlined above. He is now out over 3 years. He has a high chance of being cured. I am not getting routine scans at this point. I will get a chest x-ray to survey for any possible second malignancies related to the chemotherapy and radiation therapy that he got in the past. Echocardiogram done last year was normal 2 years out from completion of his treatment.  I will transition his care to Dr. Alen Blew. I told him if he can reestablish care with a primary care physician that he could graduate from our practice in the future.   CC: Patient Care Team: Everardo Beals as PCP - General   Annia Belt, MD 2/16/20156:06 PM

## 2013-07-24 ENCOUNTER — Ambulatory Visit (HOSPITAL_COMMUNITY)
Admission: RE | Admit: 2013-07-24 | Discharge: 2013-07-24 | Disposition: A | Discharge status: Home / Self Care | Payer: 59 | Source: Ambulatory Visit | Attending: Oncology | Admitting: Oncology

## 2013-07-24 DIAGNOSIS — Z87891 Personal history of nicotine dependence: Secondary | ICD-10-CM | POA: Insufficient documentation

## 2013-07-24 DIAGNOSIS — Z87898 Personal history of other specified conditions: Secondary | ICD-10-CM | POA: Insufficient documentation

## 2013-07-24 DIAGNOSIS — C8589 Other specified types of non-Hodgkin lymphoma, extranodal and solid organ sites: Secondary | ICD-10-CM

## 2013-07-25 ENCOUNTER — Telehealth: Payer: Self-pay | Admitting: *Deleted

## 2013-07-25 NOTE — Telephone Encounter (Signed)
Message copied by Jesse Fall on Fri Jul 25, 2013  1:53 PM ------      Message from: Annia Belt      Created: Fri Jul 25, 2013  8:27 AM       Call pt: CXR normal ------

## 2013-07-25 NOTE — Telephone Encounter (Signed)
Pt notified of normal CXR report per Dr Azucena Freed request.

## 2013-07-28 ENCOUNTER — Ambulatory Visit (HOSPITAL_COMMUNITY): Payer: 59

## 2014-07-21 ENCOUNTER — Other Ambulatory Visit (HOSPITAL_BASED_OUTPATIENT_CLINIC_OR_DEPARTMENT_OTHER): Payer: 59

## 2014-07-21 ENCOUNTER — Ambulatory Visit (HOSPITAL_BASED_OUTPATIENT_CLINIC_OR_DEPARTMENT_OTHER): Payer: 59 | Admitting: Oncology

## 2014-07-21 ENCOUNTER — Telehealth: Payer: Self-pay | Admitting: Oncology

## 2014-07-21 VITALS — BP 139/88 | HR 90 | Temp 97.9°F | Resp 18 | Ht 68.0 in | Wt 210.1 lb

## 2014-07-21 DIAGNOSIS — C8528 Mediastinal (thymic) large B-cell lymphoma, lymph nodes of multiple sites: Secondary | ICD-10-CM

## 2014-07-21 DIAGNOSIS — C8590 Non-Hodgkin lymphoma, unspecified, unspecified site: Secondary | ICD-10-CM

## 2014-07-21 DIAGNOSIS — C8599 Non-Hodgkin lymphoma, unspecified, extranodal and solid organ sites: Secondary | ICD-10-CM

## 2014-07-21 LAB — COMPREHENSIVE METABOLIC PANEL (CC13)
ALBUMIN: 4.1 g/dL (ref 3.5–5.0)
ALT: 29 U/L (ref 0–55)
AST: 22 U/L (ref 5–34)
Alkaline Phosphatase: 64 U/L (ref 40–150)
Anion Gap: 12 mEq/L — ABNORMAL HIGH (ref 3–11)
BUN: 12.3 mg/dL (ref 7.0–26.0)
CHLORIDE: 106 meq/L (ref 98–109)
CO2: 23 mEq/L (ref 22–29)
Calcium: 9.6 mg/dL (ref 8.4–10.4)
Creatinine: 0.9 mg/dL (ref 0.7–1.3)
GLUCOSE: 127 mg/dL (ref 70–140)
POTASSIUM: 3.8 meq/L (ref 3.5–5.1)
Sodium: 141 mEq/L (ref 136–145)
Total Bilirubin: 0.56 mg/dL (ref 0.20–1.20)
Total Protein: 7.1 g/dL (ref 6.4–8.3)

## 2014-07-21 LAB — CBC WITH DIFFERENTIAL/PLATELET
BASO%: 0.4 % (ref 0.0–2.0)
BASOS ABS: 0 10*3/uL (ref 0.0–0.1)
EOS ABS: 0.1 10*3/uL (ref 0.0–0.5)
EOS%: 1.9 % (ref 0.0–7.0)
HCT: 45.2 % (ref 38.4–49.9)
HGB: 15 g/dL (ref 13.0–17.1)
LYMPH%: 31.1 % (ref 14.0–49.0)
MCH: 29.3 pg (ref 27.2–33.4)
MCHC: 33.2 g/dL (ref 32.0–36.0)
MCV: 88.3 fL (ref 79.3–98.0)
MONO#: 0.4 10*3/uL (ref 0.1–0.9)
MONO%: 9.6 % (ref 0.0–14.0)
NEUT#: 2.2 10*3/uL (ref 1.5–6.5)
NEUT%: 57 % (ref 39.0–75.0)
Platelets: 204 10*3/uL (ref 140–400)
RBC: 5.12 10*6/uL (ref 4.20–5.82)
RDW: 13.3 % (ref 11.0–14.6)
WBC: 3.9 10*3/uL — AB (ref 4.0–10.3)
lymph#: 1.2 10*3/uL (ref 0.9–3.3)

## 2014-07-21 LAB — LACTATE DEHYDROGENASE (CC13): LDH: 188 U/L (ref 125–245)

## 2014-07-21 NOTE — Telephone Encounter (Signed)
Pt confirmed labs/ov per 02/16 POF, gave pt AVS.... KJ

## 2014-07-21 NOTE — Progress Notes (Signed)
Hematology and Oncology Follow Up Visit  William Mercado [id number] [birth year] 34 y.o. 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  Prior therapy:  He was found to have a huge right mediastinal lymph node mass with associated large right pleural effusion and was admitted to the hospital on an urgent basis for further evaluation on 01/27/2010. A CT guided biopsy was done on August 26. Finding showed a poorly differentiated lymphoproliferative process staining positive for B cell markers CD20 and CD 79a the pathologist's differential diagnosis was diffuse large B-cell lymphoma versus high-grade lymphoma toward granulomatosis.  He was treated with 6 cycles of CHOP Rituxan from September 2011 through 05/27/2010 and then received consolidative radiation by Dr. Arloa Koh. He received 3470 cGy in 21 fractions between January 24 and 07/26/2010.  He achieved a complete PET response.   Interim History: William Mercado returns today for a follow-up visit. He is a very pleasant gentleman with the above diagnosis have been treated in the past with Dr. Audelia Hives and followed also with Dr. Beryle Beams previously. Since the last visit, he reports no new complaints. He does not report any fevers, chills or any lymphadenopathy. He does not report any constitutional symptoms. He was married in the last year and had a honeymoon trip to [place] where he developed a respiratory tract infection and developed low-grade fevers. Since that time, he felt perfectly normal. Continues to work full-time without any hindrance or decline. He continues to do well at this time. He denies any dyspnea, chest pain, palpitations. He does not report any headaches, blurry vision, syncope or seizures. He does not report any leg edema, palpitation, orthopnea or orthopnea. He does not report any hemoptysis or hematemesis. Does  not report any nausea, vomiting, abdominal pain, change in his bowel habits. Does not report any urinary symptoms. Rest of his review of systems unremarkable.  Medications: reviewed Current Outpatient Prescriptions  Medication Sig Dispense Refill  . Multiple Vitamins-Minerals (MULTIVITAMIN WITH MINERALS) tablet Take 1 tablet by mouth daily.       No current facility-administered medications for this visit.    Allergies: No Known Allergies  Physical Exam: Blood pressure 139/88, pulse 90, temperature 97.9 F (36.6 C), temperature source Oral, resp. rate 18, height 5\' 8"  (1.727 m), weight 210 lb 1.6 oz (95.301 kg), SpO2 99 %. Wt Readings from Last 3 Encounters:  07/21/14 210 lb 1.6 oz (95.301 kg)  07/21/13 210 lb 11.2 oz (95.573 kg)  07/09/12 203 lb 9.6 oz (92.352 kg)    ECOG 0 General appearance: Pleasant, well-nourished.  HENNT: Pharynx no erythema, exudate, mass,.  Lymph nodes: No cervical, supraclavicular, or axillary lymphadenopathy Lungs: Clear to auscultation, resonant to percussion throughout Heart: Regular rhythm, no murmur, no gallop, no rub, no click, no edema Abdomen: Soft, nontender, normal bowel sounds, no mass, no organomegaly Extremities: No edema, no calf tenderness Musculoskeletal: no joint deformities Neurologic: cranial nerves grossly normal, motor strength 5 over 5, reflexes 1+ symmetric, upper body coordination normal, gait normal, Skin: No rash or ecchymosis  Lab Results: CBC W/Diff    Component Value Date/Time   WBC 3.9* 07/21/2014 0814   WBC 3.5* 11/24/2011 0725   RBC 5.12 07/21/2014 0814   RBC 5.13 11/24/2011 0725   HGB 15.0 07/21/2014 0814   HGB 15.6 11/24/2011 0725   HCT 45.2 07/21/2014 0814   HCT 44.2 11/24/2011 0725   PLT 204 07/21/2014 0814  PLT 171 11/24/2011 0725   MCV 88.3 07/21/2014 0814   MCV 86.2 11/24/2011 0725   MCH 29.3 07/21/2014 0814   MCH 30.4 11/24/2011 0725   MCHC 33.2 07/21/2014 0814   MCHC 35.3 11/24/2011 0725   RDW 13.3  07/21/2014 0814   RDW 12.5 11/24/2011 0725   LYMPHSABS 1.2 07/21/2014 0814   LYMPHSABS 1.1 02/04/2010 0753   MONOABS 0.4 07/21/2014 0814   MONOABS 0.8 02/04/2010 0753   EOSABS 0.1 07/21/2014 0814   EOSABS 0.0 02/04/2010 0753   BASOSABS 0.0 07/21/2014 0814   BASOSABS 0.0 02/04/2010 0753     Chemistry      Component Value Date/Time   NA 144 07/21/2013 1518   NA 141 01/04/2012 0722   K 3.9 07/21/2013 1518   K 4.5 01/04/2012 0722   CL 106 01/04/2012 0722   CO2 29 07/21/2013 1518   CO2 28 01/04/2012 0722   BUN 10.6 07/21/2013 1518   BUN 14 01/04/2012 0722   CREATININE 1.0 07/21/2013 1518   CREATININE 1.04 01/04/2012 0722      Component Value Date/Time   CALCIUM 10.3 07/21/2013 1518   CALCIUM 9.6 01/04/2012 0722   ALKPHOS 67 07/21/2013 1518   ALKPHOS 54 01/04/2012 0722   AST 16 07/21/2013 1518   AST 14 01/04/2012 0722   ALT 28 07/21/2013 1518   ALT 17 01/04/2012 0722   BILITOT 0.33 07/21/2013 1518   BILITOT 0.5 01/04/2012 0722       Impression:  34 year old gentleman with the following issues:  1. Stage IIB, high-grade, primary mediastinal B-cell, non-Hodgkin's lymphoma treated as outlined above. He is now out over 4 years without any evidence of recurrent disease. The plan is continue with active surveillance including clinical visits on an annual basis and imaging studies as needed.  2. Age-appropriate cancer screening and survivorship: We will continue to monitor any delayed complications related to chemotherapy and he is up-to-date on health maintenance issues.  [facility], MD 2/16/20168:46 AM

## 2014-07-22 LAB — SEDIMENTATION RATE: SED RATE: 4 mm/h (ref 0–15)

## 2015-06-22 ENCOUNTER — Telehealth: Payer: Self-pay | Admitting: Oncology

## 2015-06-22 NOTE — Telephone Encounter (Signed)
Left message in regards to appointment change 2/16 to 2/22. Appointment sent via mail.

## 2015-06-25 ENCOUNTER — Telehealth: Payer: Self-pay | Admitting: Oncology

## 2015-06-25 NOTE — Telephone Encounter (Signed)
pt cld to left voicemail to r/s appt-cld & spoke to pt and r/s appt and gave r/s time & date

## 2015-07-22 ENCOUNTER — Other Ambulatory Visit: Payer: 59

## 2015-07-22 ENCOUNTER — Ambulatory Visit: Payer: 59 | Admitting: Oncology

## 2015-07-28 ENCOUNTER — Ambulatory Visit: Payer: 59 | Admitting: Oncology

## 2015-07-28 ENCOUNTER — Other Ambulatory Visit: Payer: 59

## 2015-08-04 ENCOUNTER — Other Ambulatory Visit: Payer: Self-pay | Admitting: Oncology

## 2015-08-04 DIAGNOSIS — C8520 Mediastinal (thymic) large B-cell lymphoma, unspecified site: Secondary | ICD-10-CM

## 2015-08-05 ENCOUNTER — Ambulatory Visit (HOSPITAL_BASED_OUTPATIENT_CLINIC_OR_DEPARTMENT_OTHER): Payer: 59 | Admitting: Oncology

## 2015-08-05 ENCOUNTER — Telehealth: Payer: Self-pay | Admitting: Oncology

## 2015-08-05 ENCOUNTER — Other Ambulatory Visit (HOSPITAL_BASED_OUTPATIENT_CLINIC_OR_DEPARTMENT_OTHER): Payer: 59

## 2015-08-05 VITALS — BP 148/95 | HR 81 | Temp 98.1°F | Resp 17 | Ht 68.0 in | Wt 209.8 lb

## 2015-08-05 DIAGNOSIS — C8528 Mediastinal (thymic) large B-cell lymphoma, lymph nodes of multiple sites: Secondary | ICD-10-CM

## 2015-08-05 DIAGNOSIS — C8520 Mediastinal (thymic) large B-cell lymphoma, unspecified site: Secondary | ICD-10-CM

## 2015-08-05 LAB — CBC WITH DIFFERENTIAL/PLATELET
BASO%: 0 % (ref 0.0–2.0)
Basophils Absolute: 0 10*3/uL (ref 0.0–0.1)
EOS%: 0.3 % (ref 0.0–7.0)
Eosinophils Absolute: 0 10*3/uL (ref 0.0–0.5)
HEMATOCRIT: 43 % (ref 38.4–49.9)
HGB: 15 g/dL (ref 13.0–17.1)
LYMPH#: 1.4 10*3/uL (ref 0.9–3.3)
LYMPH%: 36.4 % (ref 14.0–49.0)
MCH: 29.8 pg (ref 27.2–33.4)
MCHC: 34.9 g/dL (ref 32.0–36.0)
MCV: 85.3 fL (ref 79.3–98.0)
MONO#: 0.2 10*3/uL (ref 0.1–0.9)
MONO%: 5.9 % (ref 0.0–14.0)
NEUT#: 2.2 10*3/uL (ref 1.5–6.5)
NEUT%: 57.4 % (ref 39.0–75.0)
Platelets: 197 10*3/uL (ref 140–400)
RBC: 5.04 10*6/uL (ref 4.20–5.82)
RDW: 12.7 % (ref 11.0–14.6)
WBC: 3.8 10*3/uL — AB (ref 4.0–10.3)

## 2015-08-05 LAB — COMPREHENSIVE METABOLIC PANEL
ALK PHOS: 67 U/L (ref 40–150)
ALT: 26 U/L (ref 0–55)
AST: 15 U/L (ref 5–34)
Albumin: 4.3 g/dL (ref 3.5–5.0)
Anion Gap: 10 mEq/L (ref 3–11)
BILIRUBIN TOTAL: 0.63 mg/dL (ref 0.20–1.20)
BUN: 10 mg/dL (ref 7.0–26.0)
CO2: 24 mEq/L (ref 22–29)
Calcium: 9.5 mg/dL (ref 8.4–10.4)
Chloride: 106 mEq/L (ref 98–109)
Creatinine: 1 mg/dL (ref 0.7–1.3)
EGFR: >90 mL/min/{1.73_m2} (ref >90)
GLUCOSE: 89 mg/dL (ref 70–140)
POTASSIUM: 3.7 meq/L (ref 3.5–5.1)
Sodium: 140 mEq/L (ref 136–145)
Total Protein: 7.5 g/dL (ref 6.4–8.3)

## 2015-08-05 NOTE — Progress Notes (Signed)
Hematology and Oncology Follow Up Visit  William Mercado [id number] 05-31-1981 35 y.o. 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  Prior therapy:  He was found to have a huge right mediastinal lymph node mass with associated large right pleural effusion and was admitted to the hospital on an urgent basis for further evaluation on 01/27/2010. A CT guided biopsy was done on August 26. Finding showed a poorly differentiated lymphoproliferative process staining positive for B cell markers CD20 and CD 79a the pathologist's differential diagnosis was diffuse large B-cell lymphoma versus high-grade lymphomatoid granulomatosis.  He was treated with 6 cycles of CHOP Rituxan from September 2011 through 05/27/2010 and then received consolidative radiation by Dr. Arloa Koh. He received 3470 cGy in 21 fractions between January 24 and 07/26/2010.  He achieved a complete PET response.   Interim History: William Mercado returns today for a follow-up visit.  Since the last visit, he continues to do very well. He does not report any fevers, chills or any lymphadenopathy. He does not report any constitutional symptoms. Continues to work full-time without any hindrance or decline. He denied any respiratory symptoms including cough, wheezing or hemoptysis.   He is planning to start having a family this year and did not go through sperm banking at the time of diagnosis.  He does not report any headaches, blurry vision, syncope or seizures. He does not report any leg edema, palpitation, orthopnea or orthopnea. He does not report any hemoptysis or hematemesis. Does not report any nausea, vomiting, abdominal pain, change in his bowel habits. Does not report any urinary symptoms. Rest of his review of systems unremarkable.  Medications: reviewed Current Outpatient Prescriptions  Medication Sig Dispense  Refill  . Multiple Vitamins-Minerals (MULTIVITAMIN WITH MINERALS) tablet Take 1 tablet by mouth daily.       No current facility-administered medications for this visit.    Allergies: No Known Allergies  Physical Exam: Blood pressure 148/95, pulse 81, temperature 98.1 F (36.7 C), temperature source Oral, resp. rate 17, height 5\' 8"  (1.727 m), weight 209 lb 12.8 oz (95.165 kg), SpO2 98 %. Wt Readings from Last 3 Encounters:  08/05/15 209 lb 12.8 oz (95.165 kg)  07/21/14 210 lb 1.6 oz (95.301 kg)  07/21/13 210 lb 11.2 oz (95.573 kg)    ECOG 0 General appearance: Awake, alert gentleman appeared without distress. HENNT: No oral ulcers or lesions. Lymph nodes: No cervical, supraclavicular, or axillary lymphadenopathy Lungs: Clear to auscultation, resonant to percussion throughout Heart: Regular rhythm, no murmur, no gallop, no rub, no click, no edema Abdomen: Soft, nontender, normal bowel sounds, no mass, no organomegaly no shifting dullness or ascites. Extremities: No edema, no calf tenderness Musculoskeletal: no joint deformities Neurologic: No deficits noted. Skin: No rash or ecchymosis  Lab Results: CBC W/Diff    Component Value Date/Time   WBC 3.8* 08/05/2015 1508   WBC 3.5* 11/24/2011 0725   RBC 5.04 08/05/2015 1508   RBC 5.13 11/24/2011 0725   HGB 15.0 08/05/2015 1508   HGB 15.6 11/24/2011 0725   HCT 43.0 08/05/2015 1508   HCT 44.2 11/24/2011 0725   PLT 197 08/05/2015 1508   PLT 171 11/24/2011 0725   MCV 85.3 08/05/2015 1508   MCV 86.2 11/24/2011 0725   MCH 29.8 08/05/2015 1508   MCH 30.4 11/24/2011 0725   MCHC 34.9 08/05/2015 1508   MCHC 35.3 11/24/2011 0725   RDW 12.7  08/05/2015 1508   RDW 12.5 11/24/2011 0725   LYMPHSABS 1.4 08/05/2015 1508   LYMPHSABS 1.1 02/04/2010 0753   MONOABS 0.2 08/05/2015 1508   MONOABS 0.8 02/04/2010 0753   EOSABS 0.0 08/05/2015 1508   EOSABS 0.0 02/04/2010 0753   BASOSABS 0.0 08/05/2015 1508   BASOSABS 0.0 02/04/2010 0753      Chemistry      Component Value Date/Time   NA 141 07/21/2014 0814   NA 141 01/04/2012 0722   K 3.8 07/21/2014 0814   K 4.5 01/04/2012 0722   CL 106 01/04/2012 0722   CO2 23 07/21/2014 0814   CO2 28 01/04/2012 0722   BUN 12.3 07/21/2014 0814   BUN 14 01/04/2012 0722   CREATININE 0.9 07/21/2014 0814   CREATININE 1.04 01/04/2012 0722      Component Value Date/Time   CALCIUM 9.6 07/21/2014 0814   CALCIUM 9.6 01/04/2012 0722   ALKPHOS 64 07/21/2014 0814   ALKPHOS 54 01/04/2012 0722   AST 22 07/21/2014 0814   AST 14 01/04/2012 0722   ALT 29 07/21/2014 0814   ALT 17 01/04/2012 0722   BILITOT 0.56 07/21/2014 0814   BILITOT 0.5 01/04/2012 0722       Impression:  34 year old gentleman with the following issues:  1. Stage IIB, high-grade, primary mediastinal B-cell, non-Hodgkin's lymphoma. He is status post chemotherapy followed by consolidative radiation therapy and currently in remission since 2012.  His laboratory data and physical examination today do not suggest any tumor recurrence. The plan is to continue with active surveillance and examined him on a yearly basis.   2. Age-appropriate cancer screening and survivorship: We will continue to monitor any delayed complications related to chemotherapy and he is up-to-date on health maintenance issues. He is desiring to start a family and not sure about his fertility issues. I do not anticipate any major problems given the CHOP regimen not associated with high rate of infertility.  3. Follow-up: Will be in 12 months.  William Button, MD 3/2/20173:29 PM

## 2015-08-05 NOTE — Telephone Encounter (Signed)
per pof to sch pt appt-gave pt copy of avs °

## 2016-08-08 ENCOUNTER — Other Ambulatory Visit: Payer: 59

## 2016-08-08 ENCOUNTER — Telehealth: Payer: Self-pay | Admitting: Oncology

## 2016-08-08 ENCOUNTER — Ambulatory Visit (HOSPITAL_BASED_OUTPATIENT_CLINIC_OR_DEPARTMENT_OTHER): Payer: 59 | Admitting: Oncology

## 2016-08-08 VITALS — BP 140/90 | HR 100 | Temp 98.2°F | Resp 19 | Ht 68.0 in | Wt 211.3 lb

## 2016-08-08 DIAGNOSIS — C8528 Mediastinal (thymic) large B-cell lymphoma, lymph nodes of multiple sites: Secondary | ICD-10-CM

## 2016-08-08 DIAGNOSIS — C8520 Mediastinal (thymic) large B-cell lymphoma, unspecified site: Secondary | ICD-10-CM

## 2016-08-08 NOTE — Telephone Encounter (Signed)
Gave patient AVS and calender per 08/08/2016 los

## 2016-08-08 NOTE — Progress Notes (Signed)
Hematology and Oncology Follow Up Visit  Clifford Westmoreland [id number] [birth year] 36 y.o. 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 He was diagnosed with stage IIB.  Prior therapy:  He was found to have a huge right mediastinal lymph node mass with associated large right pleural effusion and was admitted to the hospital on an urgent basis for further evaluation on 01/27/2010. A CT guided biopsy was done on August 26. Finding showed a poorly differentiated lymphoproliferative process staining positive for B cell markers CD20 and CD 79a the pathologist's differential diagnosis was diffuse large B-cell lymphoma versus high-grade lymphomatoid granulomatosis.  He was treated with 6 cycles of CHOP Rituxan from September 2011 through 05/27/2010 and then received consolidative radiation by Dr. Arloa Koh. He received 3470 cGy in 21 fractions between January 24 and 07/26/2010.  He achieved a complete PET response.   Interim History: Mr. Pace returns today for a follow-up visit.  Since the last visit, he reports no major changes in his health. He continues to be in excellent shape and excellent performance status. He does not report any fevers, chills or any lymphadenopathy. He does not report any constitutional symptoms. Continues to work full-time without any hindrance or decline. He denied any respiratory symptoms including cough, wheezing or hemoptysis. He had a routine physical in December 2017 which did not show any major abnormalities.  He does not report any headaches, blurry vision, syncope or seizures. He does not report any leg edema, palpitation, orthopnea or orthopnea. He does not report any hemoptysis or hematemesis. Does not report any nausea, vomiting, abdominal pain, change in his bowel habits. Does not report any urinary symptoms. Rest of his review of systems unremarkable.  Medications:  reviewed Current Outpatient Prescriptions  Medication Sig Dispense Refill  . Multiple Vitamins-Minerals (MULTIVITAMIN WITH MINERALS) tablet Take 1 tablet by mouth daily.       No current facility-administered medications for this visit.     Allergies: No Known Allergies  Physical Exam: Blood pressure 140/90, pulse 100, temperature 98.2 F (36.8 C), temperature source Oral, resp. rate 19, height 5\' 8"  (1.727 m), weight 211 lb 4.8 oz (95.8 kg), SpO2 99 %. Wt Readings from Last 3 Encounters:  08/08/16 211 lb 4.8 oz (95.8 kg)  08/05/15 209 lb 12.8 oz (95.2 kg)  07/21/14 210 lb 1.6 oz (95.3 kg)    ECOG 0 General appearance: Well appearing gentleman without distress. HENNT: No oral ulcers or lesions. Lymph nodes: No cervical, supraclavicular, or axillary lymphadenopathy Lungs: Clear to auscultation, resonant to percussion throughout Heart: Regular rhythm, no murmur, no gallop, no rub, no click, no edema Abdomen: Soft, nontender, normal bowel sounds, no mass, no shifting dullness or ascites. Extremities: No edema, no calf tenderness Musculoskeletal: no joint deformities Neurologic: No deficits noted. Skin: No rash or ecchymosis  Lab Results: CBC W/Diff    Component Value Date/Time   WBC 3.8 (L) 08/05/2015 1508   WBC 3.5 (L) 11/24/2011 0725   RBC 5.04 08/05/2015 1508   RBC 5.13 11/24/2011 0725   HGB 15.0 08/05/2015 1508   HCT 43.0 08/05/2015 1508   PLT 197 08/05/2015 1508   MCV 85.3 08/05/2015 1508   MCH 29.8 08/05/2015 1508   MCH 30.4 11/24/2011 0725   MCHC 34.9 08/05/2015 1508   MCHC 35.3 11/24/2011 0725   RDW 12.7 08/05/2015 1508   LYMPHSABS 1.4 08/05/2015 1508   MONOABS 0.2 08/05/2015 1508   EOSABS 0.0 08/05/2015 1508  BASOSABS 0.0 08/05/2015 1508     Chemistry      Component Value Date/Time   NA 140 08/05/2015 1509   K 3.7 08/05/2015 1509   CL 106 01/04/2012 0722   CO2 24 08/05/2015 1509   BUN 10.0 08/05/2015 1509   CREATININE 1.0 08/05/2015 1509       Component Value Date/Time   CALCIUM 9.5 08/05/2015 1509   ALKPHOS 67 08/05/2015 1509   AST 15 08/05/2015 1509   ALT 26 08/05/2015 1509   BILITOT 0.63 08/05/2015 1509       Impression:  36 year old gentleman with the following issues:  1. Stage IIB, high-grade, primary mediastinal B-cell, non-Hodgkin's lymphoma. He is status post chemotherapy followed by consolidative radiation therapy and currently in remission since 2012.  His physical examination did not show any evidence to suggest recurrent disease. His laboratory data obtained with his routine physical examination was within normal range.   2. Age-appropriate cancer screening and survivorship: We will continue to monitor any delayed complications related to chemotherapy and he is up-to-date on health maintenance issues.  3. Follow-up: Will be in 12 months.  Zola Button, MD 3/6/20183:56 PM

## 2017-08-07 ENCOUNTER — Inpatient Hospital Stay: Payer: 59 | Attending: Oncology | Admitting: Oncology

## 2017-08-07 ENCOUNTER — Telehealth: Payer: Self-pay | Admitting: Oncology

## 2017-08-07 VITALS — BP 139/101 | HR 84 | Temp 98.7°F | Resp 18 | Ht 68.0 in | Wt 209.8 lb

## 2017-08-07 DIAGNOSIS — C8528 Mediastinal (thymic) large B-cell lymphoma, lymph nodes of multiple sites: Secondary | ICD-10-CM | POA: Insufficient documentation

## 2017-08-07 DIAGNOSIS — C8520 Mediastinal (thymic) large B-cell lymphoma, unspecified site: Secondary | ICD-10-CM

## 2017-08-07 NOTE — Telephone Encounter (Signed)
Scheduled appt per 3/5 los - Gave patient AVS and calender per los.  

## 2017-08-07 NOTE — Progress Notes (Signed)
Hematology and Oncology Follow Up Visit  William Mercado [id number] [birth year] 37 y.o. 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 He has been in remission since that time.  Prior therapy:  CT guided biopsy was done on August 26. Pathology showed poorly differentiated lymphoproliferative process staining positive for B cell markers CD20 and CD 79a.   He was treated with 6 cycles of CHOP Rituxan from September 2011 through 05/27/2010 and then received consolidative radiation by Dr. Arloa Mercado. He received 3470 cGy in 21 fractions between January 24 and 07/26/2010.     Interim History: William Mercado is here for a follow-up visit. Since the last visit he reports no recent changes or complaints and his health. He continues to be active working full time without inability to do so. He denied any lymphadenopathy, neck masses or dyspnea on exertion. He denied any shortness of breath or difficulty breathing. He continues to get routine physical physical examination with his primary care provider on an annual basis.  He does not report any headaches, blurry vision, syncope or seizures. Does not report any fevers, chills or sweats.  Does not report any cough, wheezing or hemoptysis.  Does not report any chest pain, palpitation, orthopnea or leg edema.  Does not report any nausea, vomiting or abdominal pain.  Does not report any constipation or diarrhea.  Does not report any skeletal complaints.    Does not report frequency, urgency or hematuria.  Does not report any skin rashes or lesions. Does not report any heat or cold intolerance.  Does not report any petechiae.  Does not report any anxiety or depression.  Remaining review of systems is negative.    Medications: reviewed Current Outpatient Medications  Medication Sig Dispense Refill  . Multiple Vitamins-Minerals (MULTIVITAMIN WITH MINERALS) tablet Take 1 tablet by  mouth daily.       No current facility-administered medications for this visit.     Allergies: No Known Allergies  Physical Exam: There were no vitals taken for this visit. Wt Readings from Last 3 Encounters:  08/08/16 211 lb 4.8 oz (95.8 kg)  08/05/15 209 lb 12.8 oz (95.2 kg)  07/21/14 210 lb 1.6 oz (95.3 kg)    ECOG 0 General appearance: Alert, awake gentleman appeared without distress.  Head is normocephalic atraumatic.  Oropharynx Oral mucosa is moist and pink. Eyes: No scleral icterus. Lymph nodes: No cervical, supraclavicular, or axillary lymphadenopathy Lungs: Clear in all lung fields without rhonchi, wheezes or dullness to percussion. Heart: Regular rate and rhythm without any murmurs rubs or gallops. No edema. Abdomen: Soft, nontender, nondistended without any shifting dullness or ascites. Good bowel sounds. Musculoskeletal: Full range of motion is noted. Neurologic: No motor, sensory deficits. Skin: No rash or ecchymosis  Lab Results: CBC W/Diff    Component Value Date/Time   WBC 3.8 (L) 08/05/2015 1508   WBC 3.5 (L) 11/24/2011 0725   RBC 5.04 08/05/2015 1508   RBC 5.13 11/24/2011 0725   HGB 15.0 08/05/2015 1508   HCT 43.0 08/05/2015 1508   PLT 197 08/05/2015 1508   MCV 85.3 08/05/2015 1508   MCH 29.8 08/05/2015 1508   MCH 30.4 11/24/2011 0725   MCHC 34.9 08/05/2015 1508   MCHC 35.3 11/24/2011 0725   RDW 12.7 08/05/2015 1508   LYMPHSABS 1.4 08/05/2015 1508   MONOABS 0.2 08/05/2015 1508   EOSABS 0.0 08/05/2015 1508   BASOSABS 0.0 08/05/2015 1508  Chemistry      Component Value Date/Time   NA 140 08/05/2015 1509   K 3.7 08/05/2015 1509   CL 106 01/04/2012 0722   CO2 24 08/05/2015 1509   BUN 10.0 08/05/2015 1509   CREATININE 1.0 08/05/2015 1509      Component Value Date/Time   CALCIUM 9.5 08/05/2015 1509   ALKPHOS 67 08/05/2015 1509   AST 15 08/05/2015 1509   ALT 26 08/05/2015 1509   BILITOT 0.63 08/05/2015 1509       Impression:   37 year old gentleman with the following issues:  1. Stage IIB, non-Hodgkin's lymphoma. Mediastinal B-cell type. He completed that therapy in 2012 and has been in remission at this time.   The natural course of this disease was discussed with the patient today in detail. He does not have any evidence to suggest recurrent disease and the fact that he has been in remission for at least 7 years indicated is very likely he is cured from this malignancy with low risk of relapse. I recommended that he continue annual monitoring with repeat labs and imaging studies if symptoms indicate any changes.  2. Age-appropriate cancer screening and survivorship: He is up-to-date on age-appropriate cancer screening and continues to get annual physical examination.  3. Follow-up: Will be yearly and sooner if needed to.  15  minutes was spent with the patient face-to-face today.  More than 50% of time was dedicated to patient counseling, education and answering questions regarding his prognosis and risk of relapse.  Zola Button, MD 3/5/20193:29 PM

## 2018-08-08 ENCOUNTER — Ambulatory Visit: Payer: 59 | Admitting: Oncology

## 2024-02-19 ENCOUNTER — Encounter (INDEPENDENT_AMBULATORY_CARE_PROVIDER_SITE_OTHER): Payer: Self-pay

## 2024-03-06 ENCOUNTER — Ambulatory Visit (INDEPENDENT_AMBULATORY_CARE_PROVIDER_SITE_OTHER)

## 2024-03-06 ENCOUNTER — Encounter (INDEPENDENT_AMBULATORY_CARE_PROVIDER_SITE_OTHER): Payer: Self-pay

## 2024-03-06 VITALS — BP 148/90 | HR 96 | Temp 98.1°F | Ht 69.0 in | Wt 200.0 lb

## 2024-03-06 DIAGNOSIS — H6123 Impacted cerumen, bilateral: Secondary | ICD-10-CM | POA: Diagnosis not present

## 2024-03-06 DIAGNOSIS — H9311 Tinnitus, right ear: Secondary | ICD-10-CM

## 2024-03-06 NOTE — Progress Notes (Signed)
 HPI:   William Mercado is a 43 y.o. male who presents as a new patient in consultation for unilateral tinnitus. Patient states that he went to a concert on September 17th and had tinnitus after the concert which subsequently resolved. The Friday following the concert the tinnitus returned intermittently for prolonged periods of time. Always in the right ear. No issues with the left ear. Patient today states that he has had the right-sided tinnitus now constantly for the last 2 weeks. He states that sometimes it does resolve when he is laying down but comes back immediately when he is upright. Denies any pain in the ears. Denies any drainage from the ears. He does take Flonase daily. He was treated with a prednisone taper and was taking zyrtec without relief. Denies any history of head trauma or ear trauma. Denies any recurrent ear infections. Denies any surgeries to the ears. No history of hearing loss. He does feel that his hearing has decreased in the right ear. He states that when he is exposed to loud or high-pitched noises now he feels discomfort in his right ear.    PMH/Meds/All/SocHx/FamHx/ROS: Past Medical History:  Diagnosis Date   Cancer (HCC)    nhl   History reviewed. No pertinent surgical history. No family history of bleeding disorders, wound healing problems or difficulty with anesthesia.  Social Connections: Not on file    Current Outpatient Medications:    Multiple Vitamins-Minerals (MULTIVITAMIN WITH MINERALS) tablet, Take 1 tablet by mouth daily.  , Disp: , Rfl:  A complete ROS was performed with pertinent positives/negatives noted in the HPI. The remainder of the ROS are negative.   Physical Exam:  BP (!) 148/90 (BP Location: Right Arm) Comment: first attempt 148/90 second attempt 141/87  Pulse 96   Temp 98.1 F (36.7 C)   Ht 5' 9 (1.753 m)   Wt 200 lb (90.7 kg)   SpO2 96%   BMI 29.53 kg/m  General: Well developed, well nourished. No acute distress. Voice  normal Head/Face: Normocephalic. No sinus tenderness. Facial nerve intact and equal bilaterally. No facial lacerations. Eyes: PERRL, no scleral icterus or conjunctival hemorrhage. EOMI. Ears: No gross deformity. Cerumen impaction bilaterally. Tympanic membrane in tact bilaterally Hearing: Normal speech reception.  Nose: No gross deformity or lesions. No purulent discharge. No turbinate hypertrophy. Mouth/Oropharynx: Lips without any lesions. Dentition fair. No mucosal lesions within the oropharynx. No tonsillar enlargement, exudate, or lesions. Pharyngeal walls symmetrical. Uvula midline. Tongue midline without lesions. Larynx: See TFL if applicable Nasopharynx: See TFL if applicable Neck: Trachea midline. No masses. No thyromegaly or nodules palpated. No crepitus. Lymphatic: No lymphadenopathy in the neck. Respiratory: No stridor or distress. Room air. Cardiovascular: Regular rate and rhythm. Extremities: No edema or cyanosis. Warm and well-perfused. Skin: No scars or lesions on face or neck. Neurologic: CN II-XII grossly intact. Moving all extremities without gross abnormality. Other:  Independent Review of Additional Tests or Records: None Procedures: Procedure: Bilateral ear microscopy and cerumen removal using microscope (CPT [phone]) - Mod 50 Pre-procedure diagnosis: bilateral cerumen impaction external auditory canals Post-procedure diagnosis: same Indication: bilateral cerumen impaction; given patient's otologic complaints and history as well as for improved and comprehensive examination of external ear and tympanic membrane, bilateral otologic examination using microscope was performed and impacted cerumen removed  Procedure: Patient was placed semi-recumbent. Both ear canals were examined using the microscope with findings above. Cerumen removed from bilateral external auditory canals using suction and currette with improvement in EAC examination and patency. Left:  EAC was patent.  TM was intact . Middle ear was aerated. Drainage: none Right: EAC was patent. TM was intact . Middle ear was aerated . Drainage: none Patient tolerated the procedure well.  Impression & Plans: William Mercado is a 43 y.o. male with right-sided tinnitus which started after going to a concert on Sept 17. Constant, non-pulsatile no other otologic symptoms.   Unilateral, non-pulsatile tinnitus, right side - ordered audiogram for further evaluation - will consider imaging if tinnitus persists  Cerumen impaction bilateral - cerumen removal under microscope with instrumentation  Follow-up in 4-6 weeks after audiogram  Adah Malkin, DO [facility]

## 2024-04-17 ENCOUNTER — Ambulatory Visit (INDEPENDENT_AMBULATORY_CARE_PROVIDER_SITE_OTHER): Admitting: Audiology

## 2024-04-17 ENCOUNTER — Ambulatory Visit (INDEPENDENT_AMBULATORY_CARE_PROVIDER_SITE_OTHER)

## 2024-04-17 ENCOUNTER — Encounter (INDEPENDENT_AMBULATORY_CARE_PROVIDER_SITE_OTHER): Payer: Self-pay

## 2024-04-17 VITALS — BP 134/83 | HR 102 | Temp 98.0°F | Wt 200.0 lb

## 2024-04-17 DIAGNOSIS — H9311 Tinnitus, right ear: Secondary | ICD-10-CM | POA: Diagnosis not present

## 2024-04-17 DIAGNOSIS — H9041 Sensorineural hearing loss, unilateral, right ear, with unrestricted hearing on the contralateral side: Secondary | ICD-10-CM

## 2024-04-17 DIAGNOSIS — H918X1 Other specified hearing loss, right ear: Secondary | ICD-10-CM

## 2024-04-17 DIAGNOSIS — H918X3 Other specified hearing loss, bilateral: Secondary | ICD-10-CM

## 2024-04-17 NOTE — Progress Notes (Signed)
 HPI:   03/06/2024 William Mercado is a 43 y.o. male who presents as a new patient in consultation for unilateral tinnitus. Patient states that he went to a concert on September 17th and had tinnitus after the concert which subsequently resolved. The Friday following the concert the tinnitus returned intermittently for prolonged periods of time. Always in the right ear. No issues with the left ear. Patient today states that he has had the right-sided tinnitus now constantly for the last 2 weeks. He states that sometimes it does resolve when he is laying down but comes back immediately when he is upright. Denies any pain in the ears. Denies any drainage from the ears. He does take Flonase daily. He was treated with a prednisone taper and was taking zyrtec without relief. Denies any history of head trauma or ear trauma. Denies any recurrent ear infections. Denies any surgeries to the ears. No history of hearing loss. He does feel that his hearing has decreased in the right ear. He states that when he is exposed to loud or high-pitched noises now he feels discomfort in his right ear.   Today (04/17/2024) Patient presents in follow-up regarding right ear tinnitus. Patient denies any improvement in symptoms since last visit. He underwent audiogram today showing asymmetric hearing loss in the high frequencies in the right ear. He denies any changes in symptoms or new complaints.   PMH/Meds/All/SocHx/FamHx/ROS: Past Medical History:  Diagnosis Date   Cancer (HCC)    nhl   History reviewed. No pertinent surgical history. No family history of bleeding disorders, wound healing problems or difficulty with anesthesia.  Social Connections: Not on file    Current Outpatient Medications:    Multiple Vitamins-Minerals (MULTIVITAMIN WITH MINERALS) tablet, Take 1 tablet by mouth daily.  , Disp: , Rfl:  A complete ROS was performed with pertinent positives/negatives noted in the HPI. The remainder of the ROS are  negative.   Physical Exam:  BP 134/83 (BP Location: Left Arm, Patient Position: Sitting, Cuff Size: Normal)   Pulse (!) 102   Temp 98 F (36.7 C)   Wt 200 lb (90.7 kg)   SpO2 97%   BMI 29.53 kg/m  General: Well developed, well nourished. No acute distress. Voice normal Head/Face: Normocephalic. No sinus tenderness. Facial nerve intact and equal bilaterally. No facial lacerations. Eyes: PERRL, no scleral icterus or conjunctival hemorrhage. EOMI. Ears: No gross deformity. Tympanic membrane in tact bilaterally Hearing: Normal speech reception.  Nose: No gross deformity or lesions. No purulent discharge. No turbinate hypertrophy. Mouth/Oropharynx: Lips without any lesions. Dentition fair. No mucosal lesions within the oropharynx. No tonsillar enlargement, exudate, or lesions. Pharyngeal walls symmetrical. Uvula midline. Tongue midline without lesions. Larynx: See TFL if applicable Nasopharynx: See TFL if applicable Neck: Trachea midline. No masses. No thyromegaly or nodules palpated. No crepitus. Lymphatic: No lymphadenopathy in the neck. Respiratory: No stridor or distress. Room air. Cardiovascular: Regular rate and rhythm. Extremities: No edema or cyanosis. Warm and well-perfused. Skin: No scars or lesions on face or neck. Neurologic: CN II-XII grossly intact. Moving all extremities without gross abnormality. Other:  Independent Review of Additional Tests or Records: None Procedures: None  Impression & Plans: Gaylon Bentz is a 43 y.o. male with right-sided tinnitus which started after going to a concert on Sept 17. Constant, non-pulsatile no other otologic symptoms.   Asymmetric HFSNHL - right Unilateral, non-pulsatile tinnitus, right side - audiogram reviewed with patient showing asymmetric hearing loss - MRI IAC ordered  Will call with MRI  results. Follow-up as needed.  Adah Malkin, DO [facility]

## 2024-04-17 NOTE — Progress Notes (Signed)
  [address], [address] [place], [place] [address] [phone]  Audiological Evaluation    Name: William Mercado     DOB:   06/20/1980      MRN:   [id number]                                                                                     Service Date: 04/17/2024     Accompanied by: unaccompanied   Patient comes today after Dr. Mila, ENT sent a referral for a hearing evaluation due to concerns with tinnitus.   Symptoms Yes Details  Hearing loss  []    Tinnitus  [x]  Right ear constant  Ear pain/ infections/pressure  []    Balance problems  []    Noise exposure history  [x]  Was intermittent and then after a concert in Aug 2025 tinnitus became to be constant.  Patient goes to 3-4 concerts a year.  Previous ear surgeries  []    Family history of hearing loss  []    Amplification  []    Other  []      Otoscopy: Right ear: Clear external ear canal and notable landmarks visualized on the tympanic membrane. Left ear:  Clear external ear canal and notable landmarks visualized on the tympanic membrane.  Tympanometry: Right ear: Normal external ear canal volume with normal middle ear pressure and tympanic membrane compliance (Type A). Findings are suggestive of normal middle ear function. Left ear: Normal external ear canal volume with normal middle ear pressure and tympanic membrane compliance (Type A). Findings are suggestive of normal middle ear function.  Hearing Evaluation The hearing test results were completed under headphones and results are deemed to be of good reliability. Test technique:  conventional    Pure tone Audiometry: Right ear- normal [phone], then moderate-severe sensorineural hearing loss from 6000 Hz - 8000 Hz. Left ear-  Normal hearing from [phone] Hz.  Speech Audiometry: Right ear- Speech Reception Threshold (SRT) was obtained at 15 dBHL. Left ear-Speech Reception Threshold (SRT) was obtained at 10 dBHL.   Word Recognition Score Tested using NU-6  (recorded) Right ear: 100% was obtained at a presentation level of 70 dBHL with contralateral masking which is deemed as  excellent. Left ear: 100% was obtained at a presentation level of 50 dBHL with contralateral masking which is deemed as  excellent.   Impression: There is a significant difference in pure-tone thresholds between ears, worse in the right ear from 6000-8000 Hz      Recommendations: Follow up with ENT as scheduled for today.  Return for a hearing evaluation if concerns with hearing changes arise or per MD recommendation. Use hearing protection when exposed to loud/damaging sounds.  Consider various tinnitus strategies, including the use of a sound generator, and/or tinnitus retraining therapy.    Vincie Linn MARIE LEROUX-MARTINEZ, AUD

## 2024-05-07 ENCOUNTER — Encounter: Payer: Self-pay | Admitting: Audiology

## 2024-05-16 ENCOUNTER — Ambulatory Visit (HOSPITAL_COMMUNITY): Admission: RE | Admit: 2024-05-16 | Discharge: 2024-05-16 | Disposition: A | Source: Ambulatory Visit

## 2024-05-16 DIAGNOSIS — H918X3 Other specified hearing loss, bilateral: Secondary | ICD-10-CM

## 2024-05-16 MED ORDER — GADOBUTROL 1 MMOL/ML IV SOLN
9.0000 mL | Freq: Once | INTRAVENOUS | Status: AC | PRN
  Administered 2024-05-16: 9 mL via INTRAVENOUS
